# Patient Record
Sex: Female | Born: 1990 | Race: White | Hispanic: No | Marital: Single | Smoking: Never smoker
Health system: Southern US, Community
[De-identification: ages and names within clinical notes are randomized; demographics above are authoritative.]

## PROBLEM LIST (undated history)

## (undated) DIAGNOSIS — K811 Chronic cholecystitis: Secondary | ICD-10-CM

## (undated) DIAGNOSIS — F329 Major depressive disorder, single episode, unspecified: Secondary | ICD-10-CM

## (undated) DIAGNOSIS — F32A Depression, unspecified: Secondary | ICD-10-CM

## (undated) DIAGNOSIS — R51 Headache: Secondary | ICD-10-CM

## (undated) DIAGNOSIS — R519 Headache, unspecified: Secondary | ICD-10-CM

## (undated) DIAGNOSIS — F419 Anxiety disorder, unspecified: Secondary | ICD-10-CM

## (undated) DIAGNOSIS — K219 Gastro-esophageal reflux disease without esophagitis: Secondary | ICD-10-CM

## (undated) HISTORY — PX: WISDOM TOOTH EXTRACTION: SHX21

## (undated) HISTORY — PX: TONSILLECTOMY: SUR1361

## (undated) HISTORY — DX: Gastro-esophageal reflux disease without esophagitis: K21.9

---
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
## 2012-07-19 ENCOUNTER — Other Ambulatory Visit (HOSPITAL_COMMUNITY)
Admission: RE | Admit: 2012-07-19 | Discharge: 2012-07-19 | Disposition: A | Discharge status: Home / Self Care | Payer: BC Managed Care – PPO | Source: Ambulatory Visit | Attending: Family Medicine | Admitting: Family Medicine

## 2012-07-19 ENCOUNTER — Ambulatory Visit (INDEPENDENT_AMBULATORY_CARE_PROVIDER_SITE_OTHER): Payer: BC Managed Care – PPO | Admitting: Physician Assistant

## 2012-07-19 ENCOUNTER — Encounter: Payer: Self-pay | Admitting: Physician Assistant

## 2012-07-19 VITALS — BP 121/84 | HR 81 | Ht 65.0 in | Wt 147.0 lb

## 2012-07-19 DIAGNOSIS — Z13220 Encounter for screening for lipoid disorders: Secondary | ICD-10-CM

## 2012-07-19 DIAGNOSIS — R5383 Other fatigue: Secondary | ICD-10-CM

## 2012-07-19 DIAGNOSIS — M25569 Pain in unspecified knee: Secondary | ICD-10-CM

## 2012-07-19 DIAGNOSIS — Z01419 Encounter for gynecological examination (general) (routine) without abnormal findings: Secondary | ICD-10-CM | POA: Insufficient documentation

## 2012-07-19 DIAGNOSIS — M25561 Pain in right knee: Secondary | ICD-10-CM

## 2012-07-19 DIAGNOSIS — Z131 Encounter for screening for diabetes mellitus: Secondary | ICD-10-CM

## 2012-07-19 DIAGNOSIS — R5381 Other malaise: Secondary | ICD-10-CM

## 2012-07-19 DIAGNOSIS — Z124 Encounter for screening for malignant neoplasm of cervix: Secondary | ICD-10-CM

## 2012-07-19 LAB — CBC
HCT: 39.8 % (ref 36.0–46.0)
MCHC: 32.9 g/dL (ref 30.0–36.0)
Platelets: 195 10*3/uL (ref 150–400)
RDW: 14.1 % (ref 11.5–15.5)

## 2012-07-19 LAB — COMPLETE METABOLIC PANEL WITH GFR
ALT: 10 U/L (ref 0–35)
Alkaline Phosphatase: 62 U/L (ref 39–117)
CO2: 27 mEq/L (ref 19–32)
Sodium: 140 mEq/L (ref 135–145)
Total Bilirubin: 0.6 mg/dL (ref 0.3–1.2)
Total Protein: 7.2 g/dL (ref 6.0–8.3)

## 2012-07-19 LAB — VITAMIN B12: Vitamin B-12: 341 pg/mL (ref 211–911)

## 2012-07-19 LAB — LIPID PANEL
HDL: 71 mg/dL (ref >39)
LDL Cholesterol: 84 mg/dL (ref 0–99)
Total CHOL/HDL Ratio: 2.3 Ratio

## 2012-07-19 MED ORDER — IBUPROFEN 600 MG PO TABS: 600.0000 mg | ORAL_TABLET | Freq: Four times a day (QID) | ORAL | Status: DC | PRN

## 2012-07-19 MED ORDER — NORGESTIM-ETH ESTRAD TRIPHASIC 0.18/0.215/0.25 MG-35 MCG PO TABS: 1.0000 | ORAL_TABLET | Freq: Every day | ORAL | Status: DC

## 2012-07-19 NOTE — Patient Instructions (Addendum)
Knee Exercises EXERCISES RANGE OF MOTION(ROM) AND STRETCHING EXERCISES These exercises may help you when beginning to rehabilitate your injury. Your symptoms may resolve with or without further involvement from your physician, physical therapist or athletic trainer. While completing these exercises, remember:   Restoring tissue flexibility helps normal motion to return to the joints. This allows healthier, less painful movement and activity.  An effective stretch should be held for at least 30 seconds.  A stretch should never be painful. You should only feel a gentle lengthening or release in the stretched tissue. STRETCH - Knee Extension, Prone  Lie on your stomach on a firm surface, such as a bed or countertop. Place your right / left knee and leg just beyond the edge of the surface. You may wish to place a towel under the far end of your right / left thigh for comfort.  Relax your leg muscles and allow gravity to straighten your knee. Your clinician may advise you to add an ankle weight if more resistance is helpful for you.  You should feel a stretch in the back of your right / left knee. Hold this position for __________ seconds. Repeat __________ times. Complete this stretch __________ times per day. * Your physician, physical therapist or athletic trainer may ask you to add ankle weight to enhance your stretch.  RANGE OF MOTION - Knee Flexion, Active  Lie on your back with both knees straight. (If this causes back discomfort, bend your opposite knee, placing your foot flat on the floor.)  Slowly slide your heel back toward your buttocks until you feel a gentle stretch in the front of your knee or thigh.  Hold for __________ seconds. Slowly slide your heel back to the starting position. Repeat __________ times. Complete this exercise __________ times per day.  STRETCH - Quadriceps, Prone   Lie on your stomach on a firm surface, such as a bed or padded floor.  Bend your right /  left knee and grasp your ankle. If you are unable to reach, your ankle or pant leg, use a belt around your foot to lengthen your reach.  Gently pull your heel toward your buttocks. Your knee should not slide out to the side. You should feel a stretch in the front of your thigh and/or knee.  Hold this position for __________ seconds. Repeat __________ times. Complete this stretch __________ times per day.  STRETCH  Hamstrings, Supine   Lie on your back. Loop a belt or towel over the ball of your right / left foot.  Straighten your right / left knee and slowly pull on the belt to raise your leg. Do not allow the right / left knee to bend. Keep your opposite leg flat on the floor.  Raise the leg until you feel a gentle stretch behind your right / left knee or thigh. Hold this position for __________ seconds. Repeat __________ times. Complete this stretch __________ times per day.  STRENGTHENING EXERCISES These exercises may help you when beginning to rehabilitate your injury. They may resolve your symptoms with or without further involvement from your physician, physical therapist or athletic trainer. While completing these exercises, remember:   Muscles can gain both the endurance and the strength needed for everyday activities through controlled exercises.  Complete these exercises as instructed by your physician, physical therapist or athletic trainer. Progress the resistance and repetitions only as guided.  You may experience muscle soreness or fatigue, but the pain or discomfort you are trying to eliminate should   never worsen during these exercises. If this pain does worsen, stop and make certain you are following the directions exactly. If the pain is still present after adjustments, discontinue the exercise until you can discuss the trouble with your clinician. STRENGTH - Quadriceps, Isometrics  Lie on your back with your right / left leg extended and your opposite knee bent.  Gradually  tense the muscles in the front of your right / left thigh. You should see either your knee cap slide up toward your hip or increased dimpling just above the knee. This motion will push the back of the knee down toward the floor/mat/bed on which you are lying.  Hold the muscle as tight as you can without increasing your pain for __________ seconds.  Relax the muscles slowly and completely in between each repetition. Repeat __________ times. Complete this exercise __________ times per day.  STRENGTH - Quadriceps, Short Arcs   Lie on your back. Place a __________ inch towel roll under your knee so that the knee slightly bends.  Raise only your lower leg by tightening the muscles in the front of your thigh. Do not allow your thigh to rise.  Hold this position for __________ seconds. Repeat __________ times. Complete this exercise __________ times per day.  OPTIONAL ANKLE WEIGHTS: Begin with ____________________, but DO NOT exceed ____________________. Increase in 1 pound/0.5 kilogram increments.  STRENGTH - Quadriceps, Straight Leg Raises  Quality counts! Watch for signs that the quadriceps muscle is working to insure you are strengthening the correct muscles and not "cheating" by substituting with healthier muscles.  Lay on your back with your right / left leg extended and your opposite knee bent.  Tense the muscles in the front of your right / left thigh. You should see either your knee cap slide up or increased dimpling just above the knee. Your thigh may even quiver.  Tighten these muscles even more and raise your leg 4 to 6 inches off the floor. Hold for __________ seconds.  Keeping these muscles tense, lower your leg.  Relax the muscles slowly and completely in between each repetition. Repeat __________ times. Complete this exercise __________ times per day.  STRENGTH - Hamstring, Curls  Lay on your stomach with your legs extended. (If you lay on a bed, your feet may hang over the  edge.)  Tighten the muscles in the back of your thigh to bend your right / left knee up to 90 degrees. Keep your hips flat on the bed/floor.  Hold this position for __________ seconds.  Slowly lower your leg back to the starting position. Repeat __________ times. Complete this exercise __________ times per day.  OPTIONAL ANKLE WEIGHTS: Begin with ____________________, but DO NOT exceed ____________________. Increase in 1 pound/0.5 kilogram increments.  STRENGTH  Quadriceps, Squats  Stand in a door frame so that your feet and knees are in line with the frame.  Use your hands for balance, not support, on the frame.  Slowly lower your weight, bending at the hips and knees. Keep your lower legs upright so that they are parallel with the door frame. Squat only within the range that does not increase your knee pain. Never let your hips drop below your knees.  Slowly return upright, pushing with your legs, not pulling with your hands. Repeat __________ times. Complete this exercise __________ times per day.  STRENGTH - Quadriceps, Wall Slides  Follow guidelines for form closely. Increased knee pain often results from poorly placed feet or knees.  Lean against   a smooth wall or door and walk your feet out 18-24 inches. Place your feet hip-width apart.  Slowly slide down the wall or door until your knees bend __________ degrees.* Keep your knees over your heels, not your toes, and in line with your hips, not falling to either side.  Hold for __________ seconds. Stand up to rest for __________ seconds in between each repetition. Repeat __________ times. Complete this exercise __________ times per day. * Your physician, physical therapist or athletic trainer will alter this angle based on your symptoms and progress. Document Released: 11/30/2004 Document Revised: 04/10/2011 Document Reviewed: 04/30/2008 [employer] Patient Information 2014 [employer].  

## 2012-07-21 NOTE — Progress Notes (Signed)
  Subjective:     Erin Ashley is a 22 y.o. female and is here for a comprehensive physical exam. The patient reports problems - She does feel tired and would like labs just to make sure no vitamin that she is deficent in. she also has some right knee pain from old sports injury that has been giving her more problems lately. Pain located behind knee with flexion..  Needs refill on OCP. Never had pap. Normal regular periods. Not sexually active.  History   Social History  . Marital Status: Single    Spouse Name: N/A    Number of Children: N/A  . Years of Education: N/A   Occupational History  . Not on file.   Social History Main Topics  . Smoking status: Never Smoker   . Smokeless tobacco: Not on file  . Alcohol Use: Yes  . Drug Use: No  . Sexually Active: No   Other Topics Concern  . Not on file   Social History Narrative  . No narrative on file   Health Maintenance  Topic Date Due  . Pap Smear  06/22/2009  . Tetanus/tdap  06/23/2010  . Influenza Vaccine  09/30/2012    The following portions of the patient's history were reviewed and updated as appropriate: allergies, current medications, past family history, past medical history, past social history, past surgical history and problem list.  Review of Systems A comprehensive review of systems was negative.   Objective:    BP 121/84  Pulse 81  Ht 5\' 5"  (1.651 m)  Wt 147 lb (66.679 kg)  BMI 24.46 kg/m2  LMP 07/08/2012 General appearance: alert, cooperative and appears stated age Head: Normocephalic, without obvious abnormality, atraumatic Eyes: conjunctivae/corneas clear. PERRL, EOM's intact. Fundi benign. Ears: normal TM's and external ear canals both ears Nose: Nares normal. Septum midline. Mucosa normal. No drainage or sinus tenderness. Throat: lips, mucosa, and tongue normal; teeth and gums normal Neck: no adenopathy, no carotid bruit, no JVD, supple, symmetrical, trachea midline and thyroid not enlarged,  symmetric, no tenderness/mass/nodules Back: symmetric, no curvature. ROM normal. No CVA tenderness. Lungs: clear to auscultation bilaterally Heart: regular rate and rhythm, S1, S2 normal, no murmur, click, rub or gallop Abdomen: soft, non-tender; bowel sounds normal; no masses,  no organomegaly Pelvic: cervix normal in appearance, external genitalia normal, no adnexal masses or tenderness, no cervical motion tenderness, uterus normal size, shape, and consistency and vagina normal without discharge Extremities: extremities normal, atraumatic, no cyanosis or edemaright knee in knee brace. No joint tenderness. Negative anterior drawer, mcmurrays. Pain with flexion in right knee. Strength 5/5 bilaterally.  Pulses: 2+ and symmetric Skin: Skin color, texture, turgor normal. No rashes or lesions Lymph nodes: Cervical, supraclavicular, and axillary nodes normal. Neurologic: Grossly normal    Assessment:    Healthy female exam.      Plan:    CPE- Screening labs were ordered. Pap completed. Ordered tSH, CBC, vit D and B12 for fatigue. Refilled OCP. Gave knee strengthen exercises. Encouraged to use ibuprofen. Follow up if not improving in next 4 weeks.   See After Visit Summary for Counseling Recommendations

## 2013-07-01 ENCOUNTER — Ambulatory Visit (INDEPENDENT_AMBULATORY_CARE_PROVIDER_SITE_OTHER): Payer: BC Managed Care – PPO | Admitting: Physician Assistant

## 2013-07-01 ENCOUNTER — Encounter: Payer: Self-pay | Admitting: Physician Assistant

## 2013-07-01 VITALS — BP 111/71 | HR 81 | Ht 65.0 in | Wt 156.0 lb

## 2013-07-01 DIAGNOSIS — F329 Major depressive disorder, single episode, unspecified: Secondary | ICD-10-CM | POA: Insufficient documentation

## 2013-07-01 DIAGNOSIS — F32A Depression, unspecified: Secondary | ICD-10-CM

## 2013-07-01 DIAGNOSIS — Z309 Encounter for contraceptive management, unspecified: Secondary | ICD-10-CM

## 2013-07-01 DIAGNOSIS — F411 Generalized anxiety disorder: Secondary | ICD-10-CM

## 2013-07-01 DIAGNOSIS — F3289 Other specified depressive episodes: Secondary | ICD-10-CM

## 2013-07-01 MED ORDER — NORGESTIM-ETH ESTRAD TRIPHASIC 0.18/0.215/0.25 MG-35 MCG PO TABS: 1.0000 | ORAL_TABLET | Freq: Every day | ORAL | Status: DC

## 2013-07-01 MED ORDER — ESCITALOPRAM OXALATE 10 MG PO TABS: 10.0000 mg | ORAL_TABLET | Freq: Every day | ORAL | Status: DC

## 2013-07-01 NOTE — Progress Notes (Signed)
   Subjective:    Patient ID: Erin Ashley, female    DOB: 11-Sep-1990, 23 y.o.   MRN: [id number]  HPI Patient is a 23 year old female who presents to the clinic for birth control refill. Should also like to discuss ongoing anxiety and depression.  She's had no problems with her birth control pill. Her last Pap was 2014 and normal. She is not sexually active. Her periods are regular and without pain or discomfort.  She's noticed she's felt down and depressed for the last 6 months. About a year ago she had labs to check for fatigue. She continues to have no motivation. Her mother has been in the hospital for the last 3 months. She is starting school in 2 weeks. She would just like something to help make her feel more motivated and uplifting. She denies any suicidal or homicidal thoughts. She continues to feel uneasy about her future. She has never tried anything for anxiety or depression.    Review of Systems  All other systems reviewed and are negative.      Objective:   Physical Exam  Constitutional: She is oriented to person, place, and time. She appears well-developed and well-nourished.  HENT:  Head: Normocephalic and atraumatic.  Cardiovascular: Normal rate, regular rhythm and normal heart sounds.   Pulmonary/Chest: Effort normal and breath sounds normal.  Neurological: She is alert and oriented to person, place, and time.  Skin: Skin is dry.  Psychiatric: She has a normal mood and affect. Her behavior is normal.          Assessment & Plan:  Contraception management-birth control refilled for one year. Patient's blood pressure and heart rate looks good. Patient seems to be tolerating birth control well. Discussed with patient she does not need a Pap smear for 2 years. She still should come in for yearly appointment to recheck tolerance.  Anxiety/depression-GAD-7 was 12. PHQ-9 was 13. Decided to start Lexapro to hopefully give her some motivation. Discussed side effects of  SSRIs. Patient aware she is worsening depression or suicidal thoughts to stop medicine and call office. Patient is aware may take 4-6 weeks to become fully effective. Followup in 4-6 weeks.

## 2013-07-30 ENCOUNTER — Encounter: Payer: Self-pay | Admitting: Physician Assistant

## 2013-07-30 ENCOUNTER — Ambulatory Visit (INDEPENDENT_AMBULATORY_CARE_PROVIDER_SITE_OTHER): Payer: BC Managed Care – PPO | Admitting: Physician Assistant

## 2013-07-30 VITALS — BP 100/62 | HR 88 | Ht 65.0 in | Wt 154.0 lb

## 2013-07-30 DIAGNOSIS — F3289 Other specified depressive episodes: Secondary | ICD-10-CM

## 2013-07-30 DIAGNOSIS — F411 Generalized anxiety disorder: Secondary | ICD-10-CM

## 2013-07-30 DIAGNOSIS — F329 Major depressive disorder, single episode, unspecified: Secondary | ICD-10-CM

## 2013-07-30 DIAGNOSIS — F32A Depression, unspecified: Secondary | ICD-10-CM

## 2013-07-30 MED ORDER — ESCITALOPRAM OXALATE 10 MG PO TABS: 10.0000 mg | ORAL_TABLET | Freq: Every day | ORAL | Status: DC

## 2013-07-30 NOTE — Progress Notes (Signed)
   Subjective:    Patient ID: Erin Ashley, female    DOB: 1990/06/15, 23 y.o.   MRN: [id number]  HPI Pt is here to follow up on anxiety and depression. She is much better on lexapro daily. She has had no side effects. She is sleeping well. She is doing well in school and much better with handling stress of her sick mother. Denies any sucidal or homicidal thoughts.    Review of Systems  All other systems reviewed and are negative.      Objective:   Physical Exam  Constitutional: She is oriented to person, place, and time. She appears well-developed and well-nourished.  HENT:  Head: Normocephalic and atraumatic.  Cardiovascular: Normal rate, regular rhythm and normal heart sounds.   Pulmonary/Chest: Effort normal and breath sounds normal. She has no wheezes.  Neurological: She is alert and oriented to person, place, and time.  Psychiatric: She has a normal mood and affect. Her behavior is normal.          Assessment & Plan:  Anxiety/depression- GAD-7 was 1. PHQ-9 was 1. Refilled lexpro for 6 months. Follow up as needed. Encouraged regular exercise.

## 2013-09-12 ENCOUNTER — Encounter: Payer: Self-pay | Admitting: Physician Assistant

## 2013-09-12 ENCOUNTER — Ambulatory Visit (INDEPENDENT_AMBULATORY_CARE_PROVIDER_SITE_OTHER): Payer: BC Managed Care – PPO | Admitting: Physician Assistant

## 2013-09-12 VITALS — BP 117/71 | HR 96 | Ht 65.0 in | Wt 155.0 lb

## 2013-09-12 DIAGNOSIS — H1045 Other chronic allergic conjunctivitis: Secondary | ICD-10-CM | POA: Diagnosis not present

## 2013-09-12 DIAGNOSIS — H1013 Acute atopic conjunctivitis, bilateral: Secondary | ICD-10-CM

## 2013-09-12 MED ORDER — AZELASTINE HCL 0.05 % OP SOLN: 1.0000 [drp] | Freq: Two times a day (BID) | OPHTHALMIC | Status: DC

## 2013-09-12 NOTE — Patient Instructions (Signed)
Allergic Conjunctivitis  The conjunctiva is a thin membrane that covers the visible white part of the eyeball and the underside of the eyelids. This membrane protects and lubricates the eye. The membrane has small blood vessels running through it that can normally be seen. When the conjunctiva becomes inflamed, the condition is called conjunctivitis. In response to the inflammation, the conjunctival blood vessels become swollen. The swelling results in redness in the normally white part of the eye.  The blood vessels of this membrane also react when a person has allergies and is then called allergic conjunctivitis. This condition usually lasts for as long as the allergy persists. Allergic conjunctivitis cannot be passed to another person (non-contagious). The likelihood of bacterial infection is great and the cause is not likely due to allergies if the inflamed eye has:  · A sticky discharge.  · Discharge or sticking together of the lids in the morning.  · Scaling or flaking of the eyelids where the eyelashes come out.  · Red swollen eyelids.  CAUSES   · Viruses.  · Irritants such as foreign bodies.  · Chemicals.  · General allergic reactions.  · Inflammation or serious diseases in the inside or the outside of the eye or the orbit (the boney cavity in which the eye sits) can cause a "red eye."  SYMPTOMS   · Eye redness.  · Tearing.  · Itchy eyes.  · Burning feeling in the eyes.  · Clear drainage from the eye.  · Allergic reaction due to pollens or ragweed sensitivity. Seasonal allergic conjunctivitis is frequent in the spring when pollens are in the air and in the fall.  DIAGNOSIS   This condition, in its many forms, is usually diagnosed based on the history and an ophthalmological exam. It usually involves both eyes. If your eyes react at the same time every year, allergies may be the cause. While most "red eyes" are due to allergy or an infection, the role of an eye (ophthalmological) exam is important. The exam  can rule out serious diseases of the eye or orbit.  TREATMENT   · Non-antibiotic eye drops, ointments, or medications by mouth may be prescribed if the ophthalmologist is sure the conjunctivitis is due to allergies alone.  · Over-the-counter drops and ointments for allergic symptoms should be used only after other causes of conjunctivitis have been ruled out, or as your caregiver suggests.  Medications by mouth are often prescribed if other allergy-related symptoms are present. If the ophthalmologist is sure that the conjunctivitis is due to allergies alone, treatment is normally limited to drops or ointments to reduce itching and burning.  HOME CARE INSTRUCTIONS   · Wash hands before and after applying drops or ointments, or touching the inflamed eye(s) or eyelids.  · Do not let the eye dropper tip or ointment tube touch the eyelid when putting medicine in your eye.  · Stop using your soft contact lenses and throw them away. Use a new pair of lenses when recovery is complete. You should run through sterilizing cycles at least three times before use after complete recovery if the old soft contact lenses are to be used. Hard contact lenses should be stopped. They need to be thoroughly sterilized before use after recovery.  · Itching and burning eyes due to allergies is often relieved by using a cool cloth applied to closed eye(s).  SEEK MEDICAL CARE IF:   · Your problems do not go away after two or three days of treatment.  ·   Your lids are sticky (especially in the morning when you wake up) or stick together.  · Discharge develops. Antibiotics may be needed either as drops, ointment, or by mouth.  · You have extreme light sensitivity.  · An oral temperature above 102° F (38.9° C) develops.  · Pain in or around the eye or any other visual symptom develops.  MAKE SURE YOU:   · Understand these instructions.  · Will watch your condition.  · Will get help right away if you are not doing well or get worse.  Document  Released: 04/08/2002 Document Revised: 04/10/2011 Document Reviewed: 03/04/2007  [employer]® Patient Information ©2015 [employer]. This information is not intended to replace advice given to you by your health care provider. Make sure you discuss any questions you have with your health care provider.

## 2013-09-12 NOTE — Progress Notes (Signed)
   Subjective:    Patient ID: Erin GellAli Ashley, female    DOB: 28-Oct-1990, 23 y.o.   MRN: [id number]  HPI Patient presents to the clinic with 4 days of bilateral red eyes and itchiness. She noted symptoms starting Tuesday. She put her contacts to go into work that night and they were very irritated. She feels like her eyes are dry.  They're better in the morning and get worse as the day goes on. She denies any vision changes. She has tried over-the-counter allergic eyedrops She denies any discharge. [place] and feels like her eyes were worse. She denies any fever, chills, cough, sinus pressure, sore throat or ear pain.       Review of Systems  All other systems reviewed and are negative.      Objective:   Physical Exam  Constitutional: She is oriented to person, place, and time. She appears well-developed and well-nourished.  HENT:  Head: Normocephalic and atraumatic.  Right Ear: External ear normal.  Left Ear: External ear normal.  Nose: Nose normal.  Mouth/Throat: Oropharynx is clear and moist. No oropharyngeal exudate.  Eyes:  Bilateral injected conjunctiva with no active discharge.  Neck: Normal range of motion. Neck supple.  Cardiovascular: Normal rate, regular rhythm and normal heart sounds.   Pulmonary/Chest: Effort normal and breath sounds normal.  Lymphadenopathy:    She has no cervical adenopathy.  Neurological: She is alert and oriented to person, place, and time.  Psychiatric: She has a normal mood and affect. Her behavior is normal.          Assessment & Plan:  Allergic conjunctivitis bilateral- discuss with patient diagnosis and gave handout. Gave patient option of our to use twice a day. Change contacts and give eyes rest from contacts for next 2 days. Follow up as needed.

## 2014-03-02 ENCOUNTER — Other Ambulatory Visit: Payer: Self-pay | Admitting: Physician Assistant

## 2014-05-12 ENCOUNTER — Other Ambulatory Visit: Payer: Self-pay | Admitting: Physician Assistant

## 2014-05-13 ENCOUNTER — Ambulatory Visit (INDEPENDENT_AMBULATORY_CARE_PROVIDER_SITE_OTHER): Payer: BLUE CROSS/BLUE SHIELD | Admitting: Physician Assistant

## 2014-05-13 ENCOUNTER — Encounter: Payer: Self-pay | Admitting: Physician Assistant

## 2014-05-13 VITALS — BP 119/77 | HR 77 | Wt 171.0 lb

## 2014-05-13 DIAGNOSIS — F411 Generalized anxiety disorder: Secondary | ICD-10-CM

## 2014-05-13 DIAGNOSIS — F329 Major depressive disorder, single episode, unspecified: Secondary | ICD-10-CM

## 2014-05-13 DIAGNOSIS — F32A Depression, unspecified: Secondary | ICD-10-CM

## 2014-05-13 MED ORDER — ESCITALOPRAM OXALATE 20 MG PO TABS: 20.0000 mg | ORAL_TABLET | Freq: Every day | ORAL | Status: DC

## 2014-05-13 NOTE — Progress Notes (Addendum)
   Subjective:    Patient ID: Erin Ashley, female    DOB: 1990/09/15, 24 y.o.   MRN: [id number]  HPI Pt presents to the clinic to discuss worsening anxiety and depression. lexapro has worked really well for her but she feels overwhelmed right now. 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 She goes to school full time and expected to keep up the house. Her dad works hard but does not clean or do anything around the house. She has daily arguments with him. She feels guilty that she never does enough. No sucidal or homicidal thoughts.    Review of Systems  All other systems reviewed and are negative.      Objective:   Physical Exam  Constitutional: She is oriented to person, place, and time. She appears well-developed and well-nourished.  HENT:  Head: Normocephalic and atraumatic.  Cardiovascular: Normal rate, regular rhythm and normal heart sounds.   Pulmonary/Chest: Effort normal and breath sounds normal.  Neurological: She is alert and oriented to person, place, and time.  Skin: Skin is dry.  Psychiatric: She has a normal mood and affect. Her behavior is normal.  Tearful when talking about all her job responsibilities.           Assessment & Plan:  Anxiety/depression- GAD-7 was 9 . PHQ-9 was 8. We decided to increase lexapro to 20mg . She tolerates medication very well. Refilled for 6 months. Discussed a chore chart at the house so that everyone helps. Discussed with patient to talk with mothers provider and see if they qualify for a home aid for a few hours a week. Follow up as needed.

## 2014-05-19 ENCOUNTER — Other Ambulatory Visit: Payer: Self-pay | Admitting: Physician Assistant

## 2014-05-19 ENCOUNTER — Telehealth: Payer: Self-pay | Admitting: *Deleted

## 2014-05-19 MED ORDER — NORGESTIM-ETH ESTRAD TRIPHASIC 0.18/0.215/0.25 MG-35 MCG PO TABS: 1.0000 | ORAL_TABLET | Freq: Every day | ORAL | Status: DC

## 2014-05-19 NOTE — Telephone Encounter (Signed)
Mother calling to see if Karie Mainlandli could get refill on birth control med. Or would she need an office visit?

## 2014-05-19 NOTE — Telephone Encounter (Signed)
Pt has up to date pap and was just seen with good BP. Sent for 1 year.

## 2014-06-12 ENCOUNTER — Ambulatory Visit (INDEPENDENT_AMBULATORY_CARE_PROVIDER_SITE_OTHER): Payer: BLUE CROSS/BLUE SHIELD | Admitting: Sports Medicine

## 2014-06-12 ENCOUNTER — Encounter: Payer: Self-pay | Admitting: Sports Medicine

## 2014-06-12 VITALS — BP 125/80 | HR 86 | Temp 98.1°F | Ht 64.0 in | Wt 169.0 lb

## 2014-06-12 DIAGNOSIS — J029 Acute pharyngitis, unspecified: Secondary | ICD-10-CM | POA: Diagnosis not present

## 2014-06-12 DIAGNOSIS — J0100 Acute maxillary sinusitis, unspecified: Secondary | ICD-10-CM | POA: Insufficient documentation

## 2014-06-12 DIAGNOSIS — J0101 Acute recurrent maxillary sinusitis: Secondary | ICD-10-CM | POA: Diagnosis not present

## 2014-06-12 LAB — POCT RAPID STREP A (OFFICE): Rapid Strep A Screen: NEGATIVE

## 2014-06-12 MED ORDER — AZITHROMYCIN 250 MG PO TABS
ORAL_TABLET | ORAL | Status: DC
Start: 2014-06-12 — End: 2014-06-22

## 2014-06-12 MED ORDER — MELOXICAM 15 MG PO TABS: ORAL_TABLET | ORAL | Status: DC

## 2014-06-12 MED ORDER — ONDANSETRON 8 MG PO TBDP: 8.0000 mg | ORAL_TABLET | Freq: Three times a day (TID) | ORAL | Status: DC | PRN

## 2014-06-12 MED ORDER — FLUTICASONE PROPIONATE 50 MCG/ACT NA SUSP: NASAL | Status: AC

## 2014-06-12 NOTE — Assessment & Plan Note (Signed)
She does have a mild hot potato type voice concerning for a pharyngeal abscess. Exam is however unremarkable and more suspicious for maxillary sinusitis. Intranasal fluticasone, azithromycin, Zofran, meloxicam. If no better in one week we will CT scan her sinuses and pharynx.

## 2014-06-12 NOTE — Progress Notes (Signed)
  Subjective:    CC: Sore throat  HPI: This is a pleasant 24 year old female, she comes in with a several day history of increasing sore throat with low-grade fevers, nausea, and dizziness. She has noted a significant change in her voice. She is post-tonsillectomy and adenoidectomy. Symptoms are severe, persistent, with radiation to the ears particularly on the right side. No cough. Only minimal runny nose.  Past medical history, Surgical history, Family history not pertinant except as noted below, Social history, Allergies, and medications have been entered into the medical record, reviewed, and no changes needed.   Review of Systems: No fevers, chills, night sweats, weight loss, chest pain, or shortness of breath.   Objective:    General: Well Developed, well nourished, and in no acute distress.  Neuro: Alert and oriented x3, extra-ocular muscles intact, sensation grossly intact.  HEENT: Normocephalic, atraumatic, pupils equal round reactive to light, neck supple, no masses, no lymphadenopathy, thyroid nonpalpable. Noted hot potato type voice. Nasopharynx and ear canals are unremarkable, she did have some erythema without exudates in the posterior oropharynx without any visible evidence of a pharyngeal pillar abscess. She did have some tenderness over the maxillary sinuses bilaterally. Skin: Warm and dry, no rashes. Cardiac: Regular rate and rhythm, no murmurs rubs or gallops, no lower extremity edema.  Respiratory: Clear to auscultation bilaterally. Not using accessory muscles, speaking in full sentences.  Rapid strep was negative.  Impression and Recommendations:

## 2014-06-19 ENCOUNTER — Ambulatory Visit: Payer: BLUE CROSS/BLUE SHIELD | Admitting: Sports Medicine

## 2014-06-22 ENCOUNTER — Ambulatory Visit (INDEPENDENT_AMBULATORY_CARE_PROVIDER_SITE_OTHER): Payer: BLUE CROSS/BLUE SHIELD | Admitting: Sports Medicine

## 2014-06-22 ENCOUNTER — Encounter: Payer: Self-pay | Admitting: Sports Medicine

## 2014-06-22 VITALS — BP 115/69 | HR 101 | Temp 98.0°F | Wt 169.0 lb

## 2014-06-22 DIAGNOSIS — J301 Allergic rhinitis due to pollen: Secondary | ICD-10-CM | POA: Diagnosis not present

## 2014-06-22 DIAGNOSIS — Z9109 Other allergy status, other than to drugs and biological substances: Secondary | ICD-10-CM | POA: Insufficient documentation

## 2014-06-22 MED ORDER — OLOPATADINE HCL 0.1 % OP SOLN: 1.0000 [drp] | Freq: Two times a day (BID) | OPHTHALMIC | Status: DC

## 2014-06-22 NOTE — Assessment & Plan Note (Signed)
Maxillary sinusitis has completely resolved with antibiotics and nasal steroids. Continue Flonase. I did advise an over-the-counter second-generation histamine blocker, she does have significant allergic conjunctivitis as well so I am going to add Patanol. Return as needed.

## 2014-06-22 NOTE — Progress Notes (Signed)
  Subjective:    CC: Follow-up  HPI: Acute maxillary sinusitis: Symptoms completely resolved with antibiotics, she does tell me she has significant itchy eyes, and wonders what else can be done. She also wonders what else oral can be taken to help her allergic symptoms. She has noted a significant improvement with using Flonase regularly.  Past medical history, Surgical history, Family history not pertinant except as noted below, Social history, Allergies, and medications have been entered into the medical record, reviewed, and no changes needed.   Review of Systems: No fevers, chills, night sweats, weight loss, chest pain, or shortness of breath.   Objective:    General: Well Developed, well nourished, and in no acute distress.  Neuro: Alert and oriented x3, extra-ocular muscles intact, sensation grossly intact.  HEENT: Normocephalic, atraumatic, pupils equal round reactive to light, neck supple, no masses, no lymphadenopathy, thyroid nonpalpable.  Skin: Warm and dry, no rashes. Cardiac: Regular rate and rhythm, no murmurs rubs or gallops, no lower extremity edema.  Respiratory: Clear to auscultation bilaterally. Not using accessory muscles, speaking in full sentences.  Impression and Recommendations:

## 2014-09-11 ENCOUNTER — Ambulatory Visit (INDEPENDENT_AMBULATORY_CARE_PROVIDER_SITE_OTHER): Payer: BLUE CROSS/BLUE SHIELD | Admitting: Family Medicine

## 2014-09-11 VITALS — BP 116/74 | HR 90 | Temp 98.3°F | Wt 175.0 lb

## 2014-09-11 DIAGNOSIS — Z9229 Personal history of other drug therapy: Secondary | ICD-10-CM

## 2014-09-11 DIAGNOSIS — Z23 Encounter for immunization: Secondary | ICD-10-CM

## 2014-09-11 NOTE — Progress Notes (Signed)
Agree with note. 

## 2014-09-11 NOTE — Progress Notes (Signed)
   Subjective:    Patient ID: Erin Ashley, female    DOB: [birth year], 24 y.o.   MRN: [id number]  HPI  Patient came into clinic today for some immunizations required for her school. Pt needed to begin her Hep B series and get her tDap vaccine.   Review of Systems     Objective:   Physical Exam        Assessment & Plan:  Pt tolerated tdap injection in left deltoid and Hep B injection in right deltoid well with no immediate complications. Pt advised of administration schedule for Hep B series. Will call to schedule those appts. No further questions or concerns.

## 2015-02-18 ENCOUNTER — Telehealth: Payer: Self-pay | Admitting: Physician Assistant

## 2015-02-18 NOTE — Telephone Encounter (Signed)
Pt mother called and stated that Su's insurance has changed and no longer will cover the lexapro and wants to know if there is another medication that she can take that is similar like a generic. She has 5 pills left of her lexapro

## 2015-02-19 ENCOUNTER — Other Ambulatory Visit: Payer: Self-pay | Admitting: Physician Assistant

## 2015-02-19 MED ORDER — ESCITALOPRAM OXALATE 20 MG PO TABS: 20.0000 mg | ORAL_TABLET | Freq: Every day | ORAL | Status: DC

## 2015-02-19 NOTE — Progress Notes (Signed)
Pt calls in and lexapro too expensive because she lost insurance. i will give good rx coupon and send to [facility] since has membership this should make affordable.

## 2015-05-04 ENCOUNTER — Encounter: Payer: Self-pay | Admitting: Physician Assistant

## 2015-05-04 ENCOUNTER — Ambulatory Visit (INDEPENDENT_AMBULATORY_CARE_PROVIDER_SITE_OTHER): Payer: Self-pay | Admitting: Physician Assistant

## 2015-05-04 VITALS — BP 116/82 | HR 68 | Wt 182.0 lb

## 2015-05-04 DIAGNOSIS — F329 Major depressive disorder, single episode, unspecified: Secondary | ICD-10-CM

## 2015-05-04 DIAGNOSIS — IMO0001 Reserved for inherently not codable concepts without codable children: Secondary | ICD-10-CM | POA: Insufficient documentation

## 2015-05-04 DIAGNOSIS — F411 Generalized anxiety disorder: Secondary | ICD-10-CM

## 2015-05-04 DIAGNOSIS — H9190 Unspecified hearing loss, unspecified ear: Secondary | ICD-10-CM

## 2015-05-04 DIAGNOSIS — F810 Specific reading disorder: Secondary | ICD-10-CM | POA: Insufficient documentation

## 2015-05-04 DIAGNOSIS — F32A Depression, unspecified: Secondary | ICD-10-CM

## 2015-05-04 MED ORDER — ESCITALOPRAM OXALATE 20 MG PO TABS: 20.0000 mg | ORAL_TABLET | Freq: Every day | ORAL | Status: DC

## 2015-05-05 NOTE — Progress Notes (Signed)
   Subjective:    Patient ID: Erin Ashley, female    DOB: 21-Apr-1990, 25 y.o.   MRN: [id number]  HPI Pt is a 25 yo female who presents to the clinic to discuss medication due to cost. Pt lost her insurance and not lexapro is 50 dollars a month. She is doing great on it but can't afford it.   She also would like note for school for testing purposes. She has hearing impairment due to frequent ear infections as a child and she has always had reading comprehension issues. 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 She really feels like she needs more time. She always had extra time in middle school. No formal learning disability.    Review of Systems  All other systems reviewed and are negative.      Objective:   Physical Exam  Constitutional: She is oriented to person, place, and time. She appears well-developed and well-nourished.  HENT:  Head: Normocephalic and atraumatic.  Cardiovascular: Normal rate, regular rhythm and normal heart sounds.   Pulmonary/Chest: Effort normal and breath sounds normal. She has no wheezes.  Neurological: She is alert and oriented to person, place, and time.  Skin: Skin is dry.  Psychiatric: She has a normal mood and affect. Her behavior is normal.          Assessment & Plan:  Depression/anxiety- PHQ-9 was 3. GAD-7 was 0. Controlled on lexapro but concerned with cost. Gave good rx coupon and they have [facility] membership which should make 0 dollars. Continue on same dose. Follow up has needed.   Reading comprehension impairment/hearing impairment- note written for more time with testing. No formal testing has been done. She has no insurance at this time so will hold off. Never dx with any learning disability as a child but has always struggled at comprehension. Discuss ADD symptoms. She screened negative in office today. She certainly could have multiple causes of difficultly comprehending.   Does not use hearing aids. Her  speech is impaired due to hearing impairment.  Reading comprehension impairment/hearing impairment-

## 2015-05-11 ENCOUNTER — Other Ambulatory Visit: Payer: Self-pay | Admitting: *Deleted

## 2015-05-11 MED ORDER — NORGESTIM-ETH ESTRAD TRIPHASIC 0.18/0.215/0.25 MG-35 MCG PO TABS: 1.0000 | ORAL_TABLET | Freq: Every day | ORAL | Status: DC

## 2015-06-25 ENCOUNTER — Ambulatory Visit (INDEPENDENT_AMBULATORY_CARE_PROVIDER_SITE_OTHER): Payer: Self-pay

## 2015-06-25 ENCOUNTER — Ambulatory Visit (INDEPENDENT_AMBULATORY_CARE_PROVIDER_SITE_OTHER): Payer: Self-pay | Admitting: Family Medicine

## 2015-06-25 ENCOUNTER — Encounter: Payer: Self-pay | Admitting: Family Medicine

## 2015-06-25 VITALS — BP 125/77 | HR 85 | Wt 188.0 lb

## 2015-06-25 DIAGNOSIS — R0602 Shortness of breath: Secondary | ICD-10-CM

## 2015-06-25 DIAGNOSIS — R0989 Other specified symptoms and signs involving the circulatory and respiratory systems: Secondary | ICD-10-CM

## 2015-06-25 DIAGNOSIS — R09A2 Foreign body sensation, throat: Secondary | ICD-10-CM

## 2015-06-25 DIAGNOSIS — J029 Acute pharyngitis, unspecified: Secondary | ICD-10-CM

## 2015-06-25 DIAGNOSIS — F458 Other somatoform disorders: Secondary | ICD-10-CM

## 2015-06-25 LAB — CBC WITH DIFFERENTIAL/PLATELET
BASOS ABS: 0 {cells}/uL (ref 0–200)
Basophils Relative: 0 %
EOS PCT: 2 %
Eosinophils Absolute: 186 cells/uL (ref 15–500)
HCT: 38.4 % (ref 35.0–45.0)
Hemoglobin: 12.5 g/dL (ref 11.7–15.5)
LYMPHS ABS: 1767 {cells}/uL (ref 850–3900)
LYMPHS PCT: 19 %
MCH: 27.7 pg (ref 27.0–33.0)
MCHC: 32.6 g/dL (ref 32.0–36.0)
MCV: 85.1 fL (ref 80.0–100.0)
MONO ABS: 465 {cells}/uL (ref 200–950)
MONOS PCT: 5 %
MPV: 11.5 fL (ref 7.5–12.5)
Neutro Abs: 6882 cells/uL (ref 1500–7800)
Neutrophils Relative %: 74 %
PLATELETS: 222 10*3/uL (ref 140–400)
RBC: 4.51 MIL/uL (ref 3.80–5.10)
RDW: 14.1 % (ref 11.0–15.0)
WBC: 9.3 10*3/uL (ref 3.8–10.8)

## 2015-06-25 LAB — POCT RAPID STREP A (OFFICE): RAPID STREP A SCREEN: NEGATIVE

## 2015-06-25 NOTE — Progress Notes (Signed)
Subjective:    CC: ST  HPI: Patient comes in today complaining of feeling like there is a lump in the back of her throat from his 2 days. She said initially she thought maybe one her birth control pills got stuck. Then yesterday it started to become very uncomfortable with eating and drinking. This morning she tried to eat a little bit a spaghetti and it actually came back up. She said today she also feels a little bit more short of breath and did not feel that way yesterday. She says even walking in from the parking lot she noticed a difference. She denies any fevers or chills. She's had a little bit of mild congestion at night but nothing worrisome. No cough. No other swelling. She does have hx of seasaonl allergies.   Past medical history, Surgical history, Family history not pertinant except as noted below, Social history, Allergies, and medications have been entered into the medical record, reviewed, and corrections made.   Review of Systems: No fevers, chills, night sweats, weight loss, chest pain, or shortness of breath.   Objective:    Physical Exam  Constitutional: She is oriented to person, place, and time and well-developed, well-nourished, and in no distress. No distress.  HENT:  Head: Normocephalic and atraumatic.  Right Ear: External ear normal.  Left Ear: External ear normal.  Nose: Nose normal.  Mouth/Throat: Oropharynx is clear and moist.  Eyes: Conjunctivae and EOM are normal. Pupils are equal, round, and reactive to light.  Neck: Neck supple. No thyromegaly present.  Cardiovascular: Normal rate and normal heart sounds.   Pulmonary/Chest: Effort normal and breath sounds normal.  Musculoskeletal: She exhibits no edema.  Neurological: She is alert and oriented to person, place, and time.  Skin: Skin is warm. She is diaphoretic.  Psychiatric: Affect and judgment normal.     Impression and Recommendations:   Globus sensation-unclear etiology at this point. I do think we  should try get her in with ENT just to make sure that she doesn't have something actually stuck in the back of her throat. Clearly it is not causing a full obstruction because she is able to eat and drink some but it's uncomfortable enough that I think it needs to be further evaluated. She doesn't strike me as particularly anxious. Oropharynx is not red. Strep test was negative.  Shortness of breath-unclear etiology. Chest exam is clear today. Pulse ox is normal. She is on birth control so certainly pulmonary embolism is a possibility that she's not very symptomatic. We'll get a chest x-ray as well as check a d-dimer.

## 2015-06-26 LAB — D-DIMER, QUANTITATIVE: D-Dimer, Quant: 0.71 ug/mL-FEU — ABNORMAL HIGH (ref 0.00–0.48)

## 2015-06-29 ENCOUNTER — Ambulatory Visit (INDEPENDENT_AMBULATORY_CARE_PROVIDER_SITE_OTHER): Payer: Self-pay | Admitting: Physician Assistant

## 2015-06-29 ENCOUNTER — Encounter: Payer: Self-pay | Admitting: Physician Assistant

## 2015-06-29 VITALS — BP 112/71 | HR 78 | Temp 98.8°F | Ht 64.0 in | Wt 188.0 lb

## 2015-06-29 DIAGNOSIS — Z309 Encounter for contraceptive management, unspecified: Secondary | ICD-10-CM

## 2015-06-29 DIAGNOSIS — K2100 Gastro-esophageal reflux disease with esophagitis, without bleeding: Secondary | ICD-10-CM

## 2015-06-29 DIAGNOSIS — K121 Other forms of stomatitis: Secondary | ICD-10-CM

## 2015-06-29 DIAGNOSIS — Z789 Other specified health status: Secondary | ICD-10-CM

## 2015-06-29 MED ORDER — NORGESTIM-ETH ESTRAD TRIPHASIC 0.18/0.215/0.25 MG-35 MCG PO TABS: 1.0000 | ORAL_TABLET | Freq: Every day | ORAL | Status: DC

## 2015-06-29 MED ORDER — MAGIC MOUTHWASH W/LIDOCAINE: ORAL | Status: DC

## 2015-06-29 NOTE — Patient Instructions (Addendum)
Mouthwash Start omeprazole Tylenol as needed.     Food Choices for Gastroesophageal Reflux Disease, Adult When you have gastroesophageal reflux disease (GERD), the foods you eat and your eating habits are very important. Choosing the right foods can help ease the discomfort of GERD. WHAT GENERAL GUIDELINES DO I NEED TO FOLLOW?  Choose fruits, vegetables, whole grains, low-fat dairy products, and low-fat meat, fish, and poultry.  Limit fats such as oils, salad dressings, butter, nuts, and avocado.  Keep a food diary to identify foods that cause symptoms.  Avoid foods that cause reflux. These may be different for different people.  Eat frequent small meals instead of three large meals each day.  Eat your meals slowly, in a relaxed setting.  Limit fried foods.  Cook foods using methods other than frying.  Avoid drinking alcohol.  Avoid drinking large amounts of liquids with your meals.  Avoid bending over or lying down until 2-3 hours after eating. WHAT FOODS ARE NOT RECOMMENDED? The following are some foods and drinks that may worsen your symptoms: Vegetables Tomatoes. Tomato juice. Tomato and spaghetti sauce. Chili peppers. Onion and garlic. Horseradish. Fruits Oranges, grapefruit, and lemon (fruit and juice). Meats High-fat meats, fish, and poultry. This includes hot dogs, ribs, ham, sausage, salami, and bacon. Dairy Whole milk and chocolate milk. Sour cream. Cream. Butter. Ice cream. Cream cheese.  Beverages Coffee and tea, with or without caffeine. Carbonated beverages or energy drinks. Condiments Hot sauce. Barbecue sauce.  Sweets/Desserts Chocolate and cocoa. Donuts. Peppermint and spearmint. Fats and Oils High-fat foods, including [place] fries and potato chips. Other Vinegar. Strong spices, such as black pepper, white pepper, red pepper, cayenne, curry powder, cloves, ginger, and chili powder. The items listed above may not be a complete list of foods and  beverages to avoid. Contact your dietitian for more information.   This information is not intended to replace advice given to you by your health care provider. Make sure you discuss any questions you have with your health care provider.   Document Released: 01/16/2005 Document Revised: 02/06/2014 Document Reviewed: 11/20/2012 Elsevier Interactive Patient Education [employer]2016 [employer] Inc[employer]

## 2015-06-29 NOTE — Progress Notes (Signed)
   Subjective:    Patient ID: Erin Ashley, female    DOB: 1990/03/12, 25 y.o.   MRN: [id number]  HPI Patient is a 25 year old female who presents to the clinic with painful mouth ulcers that started yesterday. She was seen in the clinic on Friday for shortness of breath, globus sensation, sore throat. She was sent to ENT to make sure there was not anything stuck in throat. They suspected that she had GERD. She has not started any medications for acid reflux at this time. She does feel like the things she was complaining about on Friday have improved. She has these ulcers. She has not done anything to make better. She denies eating anything out of the normal. She denies any rash on her hands or feet. She did have a fever last night but has not had one since.   Review of Systems  All other systems reviewed and are negative.      Objective:   Physical Exam  Constitutional: She is oriented to person, place, and time. She appears well-developed and well-nourished.  HENT:  Head: Normocephalic and atraumatic.  Right Ear: External ear normal.  Left Ear: External ear normal.  TM's clear.  Bilateral buccal mucosa ulcerations.  Lesions sparing tongue or oropharynx.   Eyes: Conjunctivae are normal. Right eye exhibits no discharge. Left eye exhibits no discharge.  Neck: Normal range of motion. Neck supple.  Cardiovascular: Normal rate, regular rhythm and normal heart sounds.   Pulmonary/Chest: Effort normal and breath sounds normal. She has no wheezes.  Lymphadenopathy:    She has no cervical adenopathy.  Neurological: She is alert and oriented to person, place, and time.  Psychiatric: She has a normal mood and affect. Her behavior is normal.          Assessment & Plan:  Mouth ulcers- unclear etiology. do not see any rash on hand or feet for hand food and mouth infection. No fever today but reported fever. Could be acid in mouth reacting. Will send mouthwash. Tylenol/ibuprofen for  discomfort. Keep to a GERD diet. Go ahead and start omeprazole. At least for next 2 weeks and see how smptoms improve. Hold any NSAIDs.   GERD- I would like for patient to cut back on acidic foods until ulcers resolve. Start omeprazole daily. Hold NSAIDs.   Uses OCP- no insurance right now. Needs pap. Will refill for another 3 months. She hopes to start working in near future.    Discussed elevated d-dimer. No SOB today. I am not concerned with PE.

## 2015-06-30 DIAGNOSIS — K2100 Gastro-esophageal reflux disease with esophagitis, without bleeding: Secondary | ICD-10-CM | POA: Insufficient documentation

## 2015-06-30 DIAGNOSIS — K121 Other forms of stomatitis: Secondary | ICD-10-CM | POA: Insufficient documentation

## 2015-08-09 ENCOUNTER — Ambulatory Visit (INDEPENDENT_AMBULATORY_CARE_PROVIDER_SITE_OTHER): Payer: Self-pay | Admitting: Physician Assistant

## 2015-08-09 ENCOUNTER — Encounter: Payer: Self-pay | Admitting: Physician Assistant

## 2015-08-10 NOTE — Progress Notes (Signed)
   Subjective:    Patient ID: Erin Ashley, female    DOB: 05/04/1990, 25 y.o.   MRN: [id number]  HPI    Review of Systems     Objective:   Physical Exam        Assessment & Plan:  Canceled patient.

## 2015-10-11 ENCOUNTER — Encounter: Payer: Self-pay | Admitting: Physician Assistant

## 2015-10-11 ENCOUNTER — Ambulatory Visit (INDEPENDENT_AMBULATORY_CARE_PROVIDER_SITE_OTHER): Payer: Self-pay

## 2015-10-11 ENCOUNTER — Other Ambulatory Visit (HOSPITAL_COMMUNITY)
Admission: RE | Admit: 2015-10-11 | Discharge: 2015-10-11 | Disposition: A | Discharge status: Home / Self Care | Payer: Self-pay | Source: Ambulatory Visit | Attending: Physician Assistant | Admitting: Physician Assistant

## 2015-10-11 ENCOUNTER — Ambulatory Visit (INDEPENDENT_AMBULATORY_CARE_PROVIDER_SITE_OTHER): Payer: Self-pay | Admitting: Physician Assistant

## 2015-10-11 VITALS — BP 127/82 | HR 77 | Temp 98.6°F | Wt 192.0 lb

## 2015-10-11 DIAGNOSIS — Z9109 Other allergy status, other than to drugs and biological substances: Secondary | ICD-10-CM

## 2015-10-11 DIAGNOSIS — R2230 Localized swelling, mass and lump, unspecified upper limb: Secondary | ICD-10-CM | POA: Insufficient documentation

## 2015-10-11 DIAGNOSIS — R1032 Left lower quadrant pain: Secondary | ICD-10-CM

## 2015-10-11 DIAGNOSIS — J301 Allergic rhinitis due to pollen: Secondary | ICD-10-CM

## 2015-10-11 DIAGNOSIS — Z01419 Encounter for gynecological examination (general) (routine) without abnormal findings: Secondary | ICD-10-CM | POA: Insufficient documentation

## 2015-10-11 DIAGNOSIS — Z23 Encounter for immunization: Secondary | ICD-10-CM

## 2015-10-11 DIAGNOSIS — K5900 Constipation, unspecified: Secondary | ICD-10-CM | POA: Insufficient documentation

## 2015-10-11 DIAGNOSIS — R2231 Localized swelling, mass and lump, right upper limb: Secondary | ICD-10-CM

## 2015-10-11 DIAGNOSIS — N760 Acute vaginitis: Secondary | ICD-10-CM | POA: Insufficient documentation

## 2015-10-11 DIAGNOSIS — N898 Other specified noninflammatory disorders of vagina: Secondary | ICD-10-CM

## 2015-10-11 DIAGNOSIS — Z113 Encounter for screening for infections with a predominantly sexual mode of transmission: Secondary | ICD-10-CM | POA: Insufficient documentation

## 2015-10-11 DIAGNOSIS — Z124 Encounter for screening for malignant neoplasm of cervix: Secondary | ICD-10-CM

## 2015-10-11 MED ORDER — NORGESTIM-ETH ESTRAD TRIPHASIC 0.18/0.215/0.25 MG-35 MCG PO TABS: 1.0000 | ORAL_TABLET | Freq: Every day | ORAL | 4 refills | Status: DC

## 2015-10-11 NOTE — Progress Notes (Addendum)
Subjective:     Patient ID: Erin Ashley, female   DOB: [birth year], 25 y.o.   MRN: [id number]  HPI Patient is a 25 y.o. Caucasian female with PCOS presenting today for a screening PAP smear with three acute complaints. Firstly, the patient notes that she is having some pain in her left lower quadrant. The patient states that she believes it was secondary to her constipation as it improves slightly after a bowel movement. The patient states that she had one episode of diarrhea yesterday and has been taking Miralax and chocolate to help alleviate her constipation. The patient describes her pain as underneath her belly and that it worsens with movement especially twisting. The patient notes that she does heavy lifting at [employer] and this may be associated with her current symptoms. Additoinally, the patient states that she is having swelling under her right armpit. The patient states that it is non-painful and has been present for numerous years. Lastly, the patient notes that she has been having increased congestion due to her allergies. She states that she has been taking mucinex over-the-counter with some symptom relief. Patient denies fever, fatigue, rhinorrhea, or cough.   Review of Systems  Constitutional: Negative for activity change, fatigue and fever.  HENT: Positive for sinus pressure. Negative for congestion, ear discharge, ear pain, postnasal drip, rhinorrhea, sneezing and sore throat.   Eyes: Negative for pain, discharge and itching.  Respiratory: Negative for cough, chest tightness, shortness of breath and wheezing.   Cardiovascular: Negative for chest pain and palpitations.  Gastrointestinal: Positive for abdominal pain, constipation and diarrhea. Negative for abdominal distention, anal bleeding, blood in stool, nausea and vomiting.  Endocrine: Negative for cold intolerance and heat intolerance.  Genitourinary: Negative for difficulty urinating, dysuria, flank pain, frequency, hematuria and  urgency.  Musculoskeletal: Negative for arthralgias, back pain and myalgias.  Allergic/Immunologic: Positive for environmental allergies.  Neurological: Negative for dizziness, weakness, light-headedness, numbness and headaches.      Objective:   Physical Exam  Constitutional: She is oriented to person, place, and time. She appears well-developed and well-nourished. No distress.  HENT:  Head: Normocephalic and atraumatic.  Right Ear: External ear normal.  Left Ear: External ear normal.  Nose: Nose normal.  Mouth/Throat: Oropharynx is clear and moist. No oropharyngeal exudate.  Eyes: Conjunctivae and EOM are normal. Pupils are equal, round, and reactive to light. Right eye exhibits no discharge. Left eye exhibits no discharge. No scleral icterus.  Neck: Normal range of motion. Neck supple. No JVD present. No tracheal deviation present. No thyromegaly present.  Cardiovascular: Normal rate, regular rhythm, normal heart sounds and intact distal pulses.  Exam reveals no gallop and no friction rub.   No murmur heard. Pulmonary/Chest: Effort normal and breath sounds normal. No stridor. No respiratory distress. She has no wheezes. She has no rales. She exhibits no tenderness.  Non-tender palpable mass without discrete borders in the right axilla. No erythema. N0t fluctuant.   Abdominal: Soft. Bowel sounds are normal. She exhibits no distension and no mass. There is tenderness. There is no rebound and no guarding.  Mild tenderness to palpation in the left lower quadrant. No rebound or guarding.   Genitourinary: Uterus normal. Vaginal discharge found.  Musculoskeletal: Normal range of motion. She exhibits no tenderness.  Lymphadenopathy:    She has no cervical adenopathy.  Neurological: She is alert and oriented to person, place, and time. She has normal reflexes. She displays normal reflexes. No cranial nerve deficit. She exhibits normal muscle tone. Coordination  normal.  Skin: Skin is warm and  dry. No rash noted. She is not diaphoretic. No erythema. No pallor.  Psychiatric: She has a normal mood and affect. Her behavior is normal. Judgment and thought content normal.      Assessment:     Hallie was seen today for gynecologic exam, constipation and lump under armpit.  Diagnoses and all orders for this visit:  Pollen allergies  Encounter for immunization -     Flu Vaccine QUAD 36+ mos IM  Pap smear for cervical cancer screening -     Cytology - PAP  Constipation, unspecified constipation type -     DG Abd 1 View  Abdominal pain, left lower quadrant -     DG Abd 1 View  Other orders -     Norgestimate-Ethinyl Estradiol Triphasic 0.18/0.215/0.25 MG-35 MCG tablet; Take 1 tablet by mouth daily.      Plan:     1. Screening PAP smear/vaginal discharge- The patient received a PAP smear in office today with full pelvic exam. Patient revealed increased vaginal discharge and a wet prep was obtained. Resulting pending. Will determine need for further medical intervention upon receipt of results. Patients pap smear was sent for testing of HPV testing, gonorrhea, chlamydia, trichomonas, bacterial vaginosis. Will contact patient with results. Patient was given refill for oral contraceptive.  2. Abdominal pain, left lower quadrant - Patient was sent for x-ray to further evaluate potential etiology of her symptoms. Discussed with patient that her symptoms were likely secondary to constipation. Will determine appropriate medical regimen upon receiving imaging results. Will continue to monitor.   3.  Constipation - patient to continue taking Miralax over-the-counter. Will determine need for further medical intervention upon receipt of x-ray results. Will continue to monitor.     4. Allergies - Patient was advised to take over-the-counter antihistamines such as Claritin nightly. Patient was instructed to return-to-clinic if symptoms do not improve or worsen. Will continue to monitor.  5.  Right axilla mass- I suspect fatty tissue. Will continue to monitor. May consider u/s in the future.     Summary - Patient received screening pap smear and flu shot in clinic today. Patient to obtain abdominal x-ray. Will contact patient with results of both her pap smear and x-ray. Follow-up in 1 year for annual wellness exam or sooner if symptoms persist or worsen.

## 2015-10-13 LAB — CYTOLOGY - PAP

## 2015-10-15 LAB — CERVICOVAGINAL ANCILLARY ONLY: Candida vaginitis: NEGATIVE

## 2015-12-07 ENCOUNTER — Other Ambulatory Visit: Payer: Self-pay | Admitting: Physician Assistant

## 2016-02-28 ENCOUNTER — Encounter: Payer: Self-pay | Admitting: *Deleted

## 2016-02-28 ENCOUNTER — Emergency Department
Admission: EM | Admit: 2016-02-28 | Discharge: 2016-02-28 | Disposition: A | Discharge status: Home / Self Care | Payer: Self-pay | Source: Home / Self Care | Attending: Family Medicine | Admitting: Family Medicine

## 2016-02-28 DIAGNOSIS — T59891A Toxic effect of other specified gases, fumes and vapors, accidental (unintentional), initial encounter: Secondary | ICD-10-CM

## 2016-02-28 NOTE — ED Provider Notes (Signed)
[facility]    CSN: [id number] Arrival date & time: 02/28/16  1246     History   Chief Complaint Chief Complaint  Patient presents with  . Shortness of Breath    HPI Bonney Berres is a 26 y.o. female.   While cleaning a toilet about two hours ago patient mixed Lysol with about a half cup of liquid bleach.  The resulting fumes caused her to develop nausea with an episode of vomiting.  She felt shortness of breath and tightness in her anterior chest.  She has a history of anxiety and developed a panic attack with subsequent increased heart rate, dizziness, and wheezing.  She has a family history of asthma (mother). She feels much better now with resolution of her wheezing and shortness of breath although she she still feels some tightness in her anterior chest.   The history is provided by the patient.  Shortness of Breath  Severity:  Mild Onset quality:  Sudden Duration:  2 hours Timing:  Constant Progression:  Resolved Chronicity:  New Context: occupational exposure and strong odors   Relieved by:  None tried Worsened by:  Smoke exposure and stress Ineffective treatments:  None tried Associated symptoms: chest pain, vomiting and wheezing   Associated symptoms: no cough, no diaphoresis, no fever, no headaches, no hemoptysis, no neck pain, no rash, no sore throat, no sputum production and no syncope     History reviewed. No pertinent past medical history.  Patient Active Problem List   Diagnosis Date Noted  . Constipation 10/11/2015  . Abdominal pain, left lower quadrant 10/11/2015  . Axillary mass 10/11/2015  . Mouth ulcers 06/30/2015  . Gastroesophageal reflux disease with esophagitis 06/30/2015  . Hearing impairment 05/04/2015  . 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 Pollen allergies 06/22/2014  . Anxiety state 07/01/2013  . Depression 07/01/2013    History reviewed. No pertinent surgical history.  OB History    No data available        Home Medications    Prior to Admission medications   Medication Sig Start Date End Date Taking? Authorizing Provider  escitalopram (LEXAPRO) 20 MG tablet Take 1 tablet (20 mg total) by mouth daily. 05/04/15  Yes Jade L Breeback, PA-C  TRI-ESTARYLLA 0.18/0.215/0.25 MG-35 MCG tablet TAKE 1 TABLET BY MOUTH DAILY. 12/08/15  Yes Jade L Breeback, PA-C  fluticasone (FLONASE) 50 MCG/ACT nasal spray One spray in each nostril twice a day, use left hand for right nostril, and right hand for left nostril. 06/12/14   Monica Becton, MD    Family History Family History  Problem Relation Age of Onset  . Hyperlipidemia Father   . Hypertension Father   . Diabetes Father   . Hypertension Paternal Grandmother   . Hyperlipidemia Paternal Grandmother   . Hyperlipidemia Paternal Grandfather   . Hypertension Paternal Grandfather     Social History Social History  Substance Use Topics  . Smoking status: Never Smoker  . Smokeless tobacco: Never Used  . Alcohol use Yes     Allergies   Amoxicillin and Penicillins   Review of Systems Review of Systems  Constitutional: Negative for diaphoresis and fever.  HENT: Negative for sore throat.   Respiratory: Positive for shortness of breath and wheezing. Negative for cough, hemoptysis and sputum production.   Cardiovascular: Positive for chest pain. Negative for syncope.  Gastrointestinal: Positive for vomiting.  Musculoskeletal: Negative for neck pain.  Skin: Negative for rash.  Neurological: Negative for headaches.  Psychiatric/Behavioral: The patient  is nervous/anxious.   All other systems reviewed and are negative.    Physical Exam Triage Vital Signs ED Triage Vitals  Enc Vitals Group     BP 02/28/16 1335 102/72     Pulse Rate 02/28/16 1304 76     Resp 02/28/16 1304 18     Temp 02/28/16 1335 97.7 F (36.5 C)     Temp Source 02/28/16 1335 Oral     SpO2 02/28/16 1304 96 %     Weight 02/28/16 1336 190 lb (86.2 kg)     Height  02/28/16 1336 5\' 5"  (1.651 m)     Head Circumference --      Peak Flow --      Pain Score 02/28/16 1337 5     Pain Loc --      Pain Edu? --      Excl. in GC? --    No data found.   Updated Vital Signs BP 102/72 (BP Location: Left Arm)   Pulse 83   Temp 97.7 F (36.5 C) (Oral)   Resp 16   Ht 5\' 5"  (1.651 m)   Wt 190 lb (86.2 kg)   LMP 02/26/2016   SpO2 99%   BMI 31.62 kg/m   Visual Acuity Right Eye Distance:   Left Eye Distance:   Bilateral Distance:    Right Eye Near:   Left Eye Near:    Bilateral Near:     Physical Exam  Constitutional: She appears well-developed and well-nourished. No distress.  HENT:  Head: Normocephalic.  Right Ear: External ear normal.  Left Ear: External ear normal.  Nose: Nose normal.  Mouth/Throat: Oropharynx is clear and moist.  Eyes: Conjunctivae and EOM are normal. Pupils are equal, round, and reactive to light.  Neck: Neck supple.  Cardiovascular: Normal heart sounds.   Pulmonary/Chest: Effort normal and breath sounds normal. No respiratory distress. She has no wheezes. She has no rales. She exhibits no tenderness.  Abdominal: There is no tenderness.  Musculoskeletal: She exhibits no edema.  Lymphadenopathy:    She has no cervical adenopathy.  Neurological: She is alert.  Skin: Skin is warm and dry. No rash noted. She is not diaphoretic.  Nursing note and vitals reviewed.    UC Treatments / Results  Labs (all labs ordered are listed, but only abnormal results are displayed) Labs Reviewed - No data to display  EKG  EKG Interpretation None       Radiology No results found.  Procedures Procedures (including critical care time)  Medications Ordered in UC Medications - No data to display   Initial Impression / Assessment and Plan / UC Course  I have reviewed the triage vital signs and the nursing notes.  Pertinent labs & imaging results that were available during my care of the patient were reviewed by me and  considered in my medical decision making (see chart for details).    Patient reassured: Pulmonary injury unlikely with minimal exposure to chlorine gas released from cleaning agent.  Normal exam reassuring.  With family history of asthma, may possibly develop bronchospasm over next day or two. Advised to call PCP if increased symptoms develop (?begin prednisone taper). Increase fluid intake; stay well hydrated.  Avoid further inhalation of chemical fumes, smoke, etc.    Final Clinical Impressions(s) / UC Diagnoses   Final diagnoses:  Inhalation of cleaning agent, accidental or unintentional, initial encounter    New Prescriptions New Prescriptions   No medications on file  Lattie Haw, MD 03/06/16 [phone]

## 2016-02-28 NOTE — ED Triage Notes (Signed)
Pt reports that she mixed toilet bowl cleaner and bleach while cleaning about an hour ago; immediately followed by one episode of vomiting, SOB and center of CP. She reports that the SOB has resolved. She still c/o pain in the center of her chest.

## 2016-02-28 NOTE — Discharge Instructions (Signed)
Increase fluid intake; stay well hydrated.  Avoid further inhalation of chemical fumes, smoke, etc.

## 2016-03-30 ENCOUNTER — Other Ambulatory Visit: Payer: Self-pay | Admitting: Physician Assistant

## 2016-08-28 ENCOUNTER — Encounter: Payer: Self-pay | Admitting: Osteopathic Medicine

## 2016-08-28 ENCOUNTER — Ambulatory Visit (INDEPENDENT_AMBULATORY_CARE_PROVIDER_SITE_OTHER): Payer: Self-pay | Admitting: Osteopathic Medicine

## 2016-08-28 VITALS — BP 114/76 | HR 78 | Ht 65.0 in | Wt 186.0 lb

## 2016-08-28 DIAGNOSIS — F430 Acute stress reaction: Secondary | ICD-10-CM

## 2016-08-28 DIAGNOSIS — F329 Major depressive disorder, single episode, unspecified: Secondary | ICD-10-CM

## 2016-08-28 DIAGNOSIS — F4320 Adjustment disorder, unspecified: Secondary | ICD-10-CM

## 2016-08-28 DIAGNOSIS — F4321 Adjustment disorder with depressed mood: Secondary | ICD-10-CM

## 2016-08-28 DIAGNOSIS — F410 Panic disorder [episodic paroxysmal anxiety] without agoraphobia: Secondary | ICD-10-CM

## 2016-08-28 DIAGNOSIS — F32A Depression, unspecified: Secondary | ICD-10-CM

## 2016-08-28 MED ORDER — CLONAZEPAM 0.5 MG PO TABS: 0.2500 mg | ORAL_TABLET | Freq: Two times a day (BID) | ORAL | 0 refills | Status: DC | PRN

## 2016-08-28 MED ORDER — ESCITALOPRAM OXALATE 20 MG PO TABS: 20.0000 mg | ORAL_TABLET | Freq: Every day | ORAL | 3 refills | Status: DC

## 2016-08-28 NOTE — Progress Notes (Signed)
HPI: Erin Ashley is a 26 y.o. female  who [facility] [place], [place],  for chief complaint of:  Chief Complaint  Patient presents with  . Anxiety  . Depression    Had panic attack type symptoms at the beach when [place] her mother's ashes. Has been off her Lexapro for 5 days - was scared to take it. Would like to speak to a counselor but she does not have insurance. Her mom was under palliative care.   Patient is accompanied by sister.    Past medical history, surgical history, social history and family history reviewed.  Patient Active Problem List   Diagnosis Date Noted  . Constipation 10/11/2015  . Abdominal pain, left lower quadrant 10/11/2015  . Axillary mass 10/11/2015  . Mouth ulcers 06/30/2015  . Gastroesophageal reflux disease with esophagitis 06/30/2015  . Hearing impairment 05/04/2015  . 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 Pollen allergies 06/22/2014  . Anxiety state 07/01/2013  . Depression 07/01/2013    Current medication list and allergy/intolerance information reviewed.   Current Outpatient Prescriptions on File Prior to Visit  Medication Sig Dispense Refill  . escitalopram (LEXAPRO) 20 MG tablet Take 1 tablet (20 mg total) by mouth daily. 30 tablet 5  . escitalopram (LEXAPRO) 20 MG tablet TAKE ONE TABLET BY MOUTH ONCE DAILY 30 tablet 5  . fluticasone (FLONASE) 50 MCG/ACT nasal spray One spray in each nostril twice a day, use left hand for right nostril, and right hand for left nostril. 48 g 3  . TRI-ESTARYLLA 0.18/0.215/0.25 MG-35 MCG tablet TAKE 1 TABLET BY MOUTH DAILY. 84 tablet 4   No current facility-administered medications on file prior to visit.    Allergies  Allergen Reactions  . Amoxicillin Anaphylaxis  . Penicillins     REACTION: rash around mouth      Review of Systems:  Constitutional: No recent illness, physically feels well  HEENT: No  headache  Cardiac: No  chest pain, No   pressure, No palpitations - there she was having the symptoms at time of panic episode  Respiratory:  No  shortness of breath. No  Cough  Gastrointestinal: No  abdominal pain, +occasional nausea wit hanxiety  Neurologic: No  weakness, No  Dizziness  Psychiatric: +concerns with depression, +concerns with anxiety, +panic as per HPI   Exam:  BP 114/76   Pulse 78   Ht 5\' 5"  (1.651 m)   Wt 186 lb (84.4 kg)   BMI 30.95 kg/m   Constitutional: VS see above. General Appearance: alert, well-developed, well-nourished, NAD  Eyes: Normal lids and conjunctive, non-icteric sclera  Ears, Nose, Mouth, Throat: MMM, Normal external inspection ears/nares/mouth/lips/gums.  Neck: No masses, trachea midline.   Respiratory: Normal respiratory effort.  Musculoskeletal: Gait normal.   Neurological: Normal balance/coordination. No tremor.  Skin: warm, dry, intact.   Psychiatric: Normal judgment/insight. Normal mood and affect w/ some tearing up when talking about her mom. Oriented x3.        ASSESSMENT/PLAN: The primary encounter diagnosis was Grief reaction. Diagnoses of Panic attack as reaction to stress and Depression, unspecified depression type were also pertinent to this visit.  Outpatient Encounter Prescriptions as of 08/28/2016  Medication Sig  . escitalopram (LEXAPRO) 20 MG tablet Take 1 tablet (20 mg total) by mouth daily.  . fluticasone (FLONASE) 50 MCG/ACT nasal spray One spray in each nostril twice a day, use left hand for right nostril, and right hand for left nostril.  Samuel Jester. TRI-ESTARYLLA 0.18/0.215/0.25 MG-35  MCG tablet TAKE 1 TABLET BY MOUTH DAILY.  . [DISCONTINUED] escitalopram (LEXAPRO) 20 MG tablet TAKE ONE TABLET BY MOUTH ONCE DAILY  . clonazePAM (KLONOPIN) 0.5 MG tablet Take 0.5-1 tablets (0.25-0.5 mg total) by mouth 2 (two) times daily as needed (severe anxiety / panic. Use sparingly to prevent tolerance. No refills without office visit).  . [DISCONTINUED] escitalopram  (LEXAPRO) 20 MG tablet Take 1 tablet (20 mg total) by mouth daily.   No facility-administered encounter medications on file as of 08/28/2016.      Patient Instructions  Please contact palliative care for grief counseling Please restart Lexapro Prescription provided for Clonazepam for as-needed use for severe anxiety/panic Please call us if any additional questions or concerns!     Follow-up plan: Return if symptoms worsen or fail to improve, and as directed by PCP for routine care .  Visit summary with medication list and pertinent instructions was printed for patient to review, alert us if any changes needed. All questions at time of visit were answered - patient instructed to contact office with any additional concerns. ER/RTC precautions were reviewed with the patient and understanding verbalized.   Note: Total time spent 15 minutes, greater than 50% of the visit was spent face-to-face counseling and coordinating care for the following: The primary encounter diagnosis was Grief reaction. Diagnoses of Panic attack as reaction to stress and Depression, unspecified depression type were also pertinent to this visit..Marland Kitchen

## 2016-08-28 NOTE — Patient Instructions (Signed)
Please contact palliative care for grief counseling Please restart Lexapro Prescription provided for Clonazepam for as-needed use for severe anxiety/panic Please call us if any additional questions or concerns!

## 2016-11-20 ENCOUNTER — Ambulatory Visit (INDEPENDENT_AMBULATORY_CARE_PROVIDER_SITE_OTHER): Payer: Self-pay | Admitting: Physician Assistant

## 2016-11-20 ENCOUNTER — Encounter: Payer: Self-pay | Admitting: Physician Assistant

## 2016-11-20 VITALS — BP 124/80 | HR 78 | Temp 98.1°F | Wt 187.0 lb

## 2016-11-20 DIAGNOSIS — J00 Acute nasopharyngitis [common cold]: Secondary | ICD-10-CM

## 2016-11-20 DIAGNOSIS — M940 Chondrocostal junction syndrome [Tietze]: Secondary | ICD-10-CM

## 2016-11-20 MED ORDER — MELOXICAM 15 MG PO TABS: 15.0000 mg | ORAL_TABLET | Freq: Every day | ORAL | 0 refills | Status: DC

## 2016-11-20 MED ORDER — IPRATROPIUM BROMIDE 0.06 % NA SOLN: 1.0000 | Freq: Four times a day (QID) | NASAL | 0 refills | Status: DC | PRN
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
## 2016-11-20 NOTE — Patient Instructions (Signed)
-   Meloxicam 1 tab daily for 1 week, then daily as needed for chest pain - Heating pad 2-3 times daily for 20 minutes applied to area of chest discomfort - Sudafed daily as needed for sinus congestion - Atrovent nasal spray up to 4 times daily as needed for sinus congestion   Costochondritis Costochondritis is swelling and irritation (inflammation) of the tissue (cartilage) that connects your ribs to your breastbone (sternum). This causes pain in the front of your chest. Usually, the pain:  Starts gradually.  Is in more than one rib.  This condition usually goes away on its own over time. Follow these instructions at home:  Do not do anything that makes your pain worse.  If directed, put ice on the painful area: ? Put ice in a plastic bag. ? Place a towel between your skin and the bag. ? Leave the ice on for 20 minutes, 2-3 times a day.  If directed, put heat on the affected area as often as told by your doctor. Use the heat source that your doctor tells you to use, such as a moist heat pack or a heating pad. ? Place a towel between your skin and the heat source. ? Leave the heat on for 20-30 minutes. ? Take off the heat if your skin turns bright red. This is very important if you cannot feel pain, heat, or cold. You may have a greater risk of getting burned.  Take over-the-counter and prescription medicines only as told by your doctor.  Return to your normal activities as told by your doctor. Ask your doctor what activities are safe for you.  Keep all follow-up visits as told by your doctor. This is important. Contact a doctor if:  You have chills or a fever.  Your pain does not go away or it gets worse.  You have a cough that does not go away. Get help right away if:  You are short of breath. This information is not intended to replace advice given to you by your health care provider. Make sure you discuss any questions you have with your health care provider. Document  Released: 07/05/2007 Document Revised: 08/06/2015 Document Reviewed: 05/12/2015 Elsevier Interactive Patient Education  [employer]2018 [employer]

## 2016-11-22 ENCOUNTER — Telehealth: Payer: Self-pay | Admitting: *Deleted

## 2016-11-22 MED ORDER — ONDANSETRON HCL 8 MG PO TABS: 8.0000 mg | ORAL_TABLET | Freq: Three times a day (TID) | ORAL | 0 refills | Status: DC | PRN

## 2016-11-22 NOTE — Telephone Encounter (Signed)
We can also send over zofran 8mg  every 8 hours #20 for nausea

## 2016-11-22 NOTE — Telephone Encounter (Signed)
Patient states yesterday am and this morning the patient vomited. She states through out the day she felt nauseous. She was able to eat a plain potatoe and keep it down. She denies fever, abdominal pain or headache.She does have a lot of sinus drainage. Last LMP was approximately 2 weeks ago and per patient no chance of her being pregnant. I did advise the patient that she may have a viral GI bug or she could be getting nauseous from the sinus drainage she is having. Advised to continue to stay hydrated drinking small sips of fluid such as water, ginger ale, sprite and continue with BRAT diet. Also for the drainage she could try Mucinex. Advised and continue flonase. The mucinex can cause dry mouth so advised to really try to stay hydrated. If any new symptoms developed such as fever, abdominal pain, or vomiting gets worse or does not go away then call back to schedule appointment.

## 2016-11-22 NOTE — Telephone Encounter (Signed)
zofran sent. Tried to call patient back but vm was full

## 2016-11-23 ENCOUNTER — Encounter: Payer: Self-pay | Admitting: Physician Assistant

## 2016-11-23 ENCOUNTER — Ambulatory Visit (INDEPENDENT_AMBULATORY_CARE_PROVIDER_SITE_OTHER): Payer: Self-pay

## 2016-11-23 ENCOUNTER — Ambulatory Visit (INDEPENDENT_AMBULATORY_CARE_PROVIDER_SITE_OTHER): Payer: Self-pay | Admitting: Physician Assistant

## 2016-11-23 VITALS — BP 127/86 | HR 95 | Temp 98.4°F | Wt 177.0 lb

## 2016-11-23 DIAGNOSIS — K5900 Constipation, unspecified: Secondary | ICD-10-CM

## 2016-11-23 DIAGNOSIS — R1114 Bilious vomiting: Secondary | ICD-10-CM

## 2016-11-23 DIAGNOSIS — R1011 Right upper quadrant pain: Secondary | ICD-10-CM | POA: Insufficient documentation

## 2016-11-23 DIAGNOSIS — R1012 Left upper quadrant pain: Secondary | ICD-10-CM

## 2016-11-23 LAB — CBC WITH DIFFERENTIAL/PLATELET
Basophils Absolute: 40 cells/uL (ref 0–200)
Basophils Relative: 0.5 %
Eosinophils Absolute: 80 cells/uL (ref 15–500)
Eosinophils Relative: 1 %
HCT: 40.7 % (ref 35.0–45.0)
Hemoglobin: 13.6 g/dL (ref 11.7–15.5)
Lymphs Abs: 1136 cells/uL (ref 850–3900)
MCH: 27.4 pg (ref 27.0–33.0)
MCHC: 33.4 g/dL (ref 32.0–36.0)
MCV: 82.1 fL (ref 80.0–100.0)
MPV: 12.7 fL — ABNORMAL HIGH (ref 7.5–12.5)
Monocytes Relative: 6.1 %
Neutro Abs: 6256 cells/uL (ref 1500–7800)
Neutrophils Relative %: 78.2 %
Platelets: 241 10*3/uL (ref 140–400)
RBC: 4.96 10*6/uL (ref 3.80–5.10)
RDW: 13.9 % (ref 11.0–15.0)
Total Lymphocyte: 14.2 %
WBC mixed population: 488 cells/uL (ref 200–950)
WBC: 8 10*3/uL (ref 3.8–10.8)

## 2016-11-23 LAB — COMPREHENSIVE METABOLIC PANEL
AG RATIO: 1.5 (calc) (ref 1.0–2.5)
ALT: 41 U/L — AB (ref 6–29)
AST: 24 U/L (ref 10–30)
Albumin: 4.7 g/dL (ref 3.6–5.1)
Alkaline phosphatase (APISO): 77 U/L (ref 33–115)
BUN: 8 mg/dL (ref 7–25)
CHLORIDE: 104 mmol/L (ref 98–110)
CO2: 22 mmol/L (ref 20–32)
CREATININE: 0.77 mg/dL (ref 0.50–1.10)
Calcium: 9.6 mg/dL (ref 8.6–10.2)
GLOBULIN: 3.1 g/dL (ref 1.9–3.7)
GLUCOSE: 95 mg/dL (ref 65–99)
Potassium: 4 mmol/L (ref 3.5–5.3)
SODIUM: 137 mmol/L (ref 135–146)
TOTAL PROTEIN: 7.8 g/dL (ref 6.1–8.1)
Total Bilirubin: 0.5 mg/dL (ref 0.2–1.2)

## 2016-11-23 LAB — LIPASE: Lipase: 14 U/L (ref 7–60)

## 2016-11-23 LAB — POCT URINE PREGNANCY: Preg Test, Ur: NEGATIVE

## 2016-11-23 MED ORDER — ONDANSETRON HCL 4 MG PO TABS
4.0000 mg | ORAL_TABLET | Freq: Once | ORAL | Status: AC
  Administered 2016-11-23: 4 mg via ORAL

## 2016-11-23 NOTE — Patient Instructions (Signed)
- X-ray and labs today - Urine pregnancy test was negative - Pick up zofran from pharmacy: dissolve 1 tab under the tongue every 8 hours as needed for nausea/vomiting - Clear liquid diet for the next 12-24 hours. Then introduce a bland diet   Clear Liquid Diet, Adult A clear liquid diet is a diet that includes only liquids that you can see through. You may need to follow a clear liquid diet if:  You develop a medical condition right before or after you have surgery.  You were not able to eat food for a long period of time.  You had a condition that gave you diarrhea.  You are going to have an exam, such as a colonoscopy, in which instruments will be put into your body to look at parts of your digestive system.  You are going to have bowel surgery.  The usual goals of this diet are:  To rest the stomach and digestive system as much as possible.  To keep you hydrated.  To make sure you get some calories for energy.  To help you return to normal digestion.  Most people need to follow this diet for only a short period of time. What do I need to know about this diet?  A clear liquid is a liquid that you can see through when you hold it up to a light.  A clear liquid diet does not provide all the nutrients that you need. It is important to choose a variety of the liquids that are allowed on this diet. That way, you will get as many nutrients as possible.  If you are not sure whether you can have certain items, ask your health care provider. What can I have?  Water and flavored water.  Fruit juices that do not have pulp, such as cranberry juice and apple juice.  Tea and coffee without milk or cream.  Clear bouillon or broth.  Broth-based soups that have been strained.  Flavored gelatins.  Honey.  Sugar water.  Frozen ice or frozen ice pops that do not contain milk, yogurt, fruit pieces, or fruit pulp.  Clear sodas.  Clear sports drinks. The items listed above may  not be a complete list of recommended liquids. Contact your dietitian for more options. What can I not have?  Juices that have pulp.  Milk.  Cream or cream-based soups.  Yogurt. The items listed above may not be a complete list of liquids to avoid. Contact your dietitian for more information. Summary  A clear liquid diet is a diet that includes only liquids that you can see through.  The goal of this diet is to help you recover by resting your digestive system, keeping you hydrated, and providing nutrients.  Make sure to avoid liquids with milk, cream, or pulp while on this diet. This information is not intended to replace advice given to you by your health care provider. Make sure you discuss any questions you have with your health care provider. Document Released: 01/16/2005 Document Revised: 08/31/2015 Document Reviewed: 12/13/2012 Elsevier Interactive Patient Education  2018 [employer]   [place] Diet A bland diet consists of foods that do not have a lot of fat or fiber. Foods without fat or fiber are easier for the body to digest. They are also less likely to irritate your mouth, throat, stomach, and other parts of your gastrointestinal tract. A bland diet is sometimes called a BRAT diet. What is my plan? Your health care provider or dietitian may  recommend specific changes to your diet to prevent and treat your symptoms, such as:  Eating small meals often.  Cooking food until it is soft enough to chew easily.  Chewing your food well.  Drinking fluids slowly.  Not eating foods that are very spicy, sour, or fatty.  Not eating citrus fruits, such as oranges and grapefruit.  What do I need to know about this diet?  Eat a variety of foods from the bland diet food list.  Do not follow a bland diet longer than you have to.  Ask your health care provider whether you should take vitamins. What foods can I eat? Grains  Hot cereals, such as cream of wheat. Bread,  crackers, or tortillas made from refined white flour. Rice. Vegetables Canned or cooked vegetables. Mashed or boiled potatoes. Fruits Bananas. Applesauce. Other types of cooked or canned fruit with the skin and seeds removed, such as canned peaches or pears. Meats and Other Protein Sources Scrambled eggs. Creamy peanut butter or other nut butters. Lean, well-cooked meats, such as chicken or fish. Tofu. Soups or broths. Dairy Low-fat dairy products, such as milk, cottage cheese, or yogurt. Beverages Water. Herbal tea. Apple juice. Sweets and Desserts Pudding. Custard. Fruit gelatin. Ice cream. Fats and Oils Mild salad dressings. Canola or olive oil. The items listed above may not be a complete list of allowed foods or beverages. Contact your dietitian for more options. What foods are not recommended? Foods and ingredients that are often not recommended include:  Spicy foods, such as hot sauce or salsa.  Fried foods.  Sour foods, such as pickled or fermented foods.  Raw vegetables or fruits, especially citrus or berries.  Caffeinated drinks.  Alcohol.  Strongly flavored seasonings or condiments.  The items listed above may not be a complete list of foods and beverages that are not allowed. Contact your dietitian for more information. This information is not intended to replace advice given to you by your health care provider. Make sure you discuss any questions you have with your health care provider. Document Released: 05/10/2015 Document Revised: 06/24/2015 Document Reviewed: 01/28/2014 Elsevier Interactive Patient Education  2018 [employer].

## 2016-11-23 NOTE — Progress Notes (Signed)
HPI:                                                                Erin Ashley is a 26 y.o. female who [facility] [place] today for nausea and vomiting  Patient with PMH significant for GERD and constipation presents today with nausea and vomiting x 4 days. Emesis appears to be "clear drainage." Reports she had dry heaves early this morning around 6am. Denies fever, abdominal pain, or diarrhea. Denies coffee ground or hematemesis. Endorses chills, malaise, constipation. Last BM over 1 week ago, passing flatus. Also reports sinus drainage, denies cough or congestion.  Reports she was last sexually active 2-3 weeks ago. LMP approx 2 weeks ago. She is on OCP's. Denies missed pills.   Past Medical History:  Diagnosis Date  . GERD (gastroesophageal reflux disease)    No past surgical history on file. Social History  Substance Use Topics  . Smoking status: Never Smoker  . Smokeless tobacco: Never Used  . Alcohol use Yes   family history includes Diabetes in her father; Hyperlipidemia in her father, paternal grandfather, and paternal grandmother; Hypertension in her father, paternal grandfather, and paternal grandmother.  ROS: negative except as noted in the HPI  Medications: Current Outpatient Prescriptions  Medication Sig Dispense Refill  . clonazePAM (KLONOPIN) 0.5 MG tablet Take 0.5-1 tablets (0.25-0.5 mg total) by mouth 2 (two) times daily as needed (severe anxiety / panic. Use sparingly to prevent tolerance. No refills without office visit). 10 tablet 0  . escitalopram (LEXAPRO) 20 MG tablet Take 1 tablet (20 mg total) by mouth daily. 90 tablet 3  . fluticasone (FLONASE) 50 MCG/ACT nasal spray One spray in each nostril twice a day, use left hand for right nostril, and right hand for left nostril. 48 g 3  . ipratropium (ATROVENT) 0.06 % nasal spray Place 1 spray into both nostrils 4 (four) times daily as needed. 15 mL 0  .  meloxicam (MOBIC) 15 MG tablet Take 1 tablet (15 mg total) by mouth daily. 30 tablet 0  . ondansetron (ZOFRAN) 8 MG tablet Take 1 tablet (8 mg total) by mouth every 8 (eight) hours as needed for nausea or vomiting. 20 tablet 0  . TRI-ESTARYLLA 0.18/0.215/0.25 MG-35 MCG tablet TAKE 1 TABLET BY MOUTH DAILY. 84 tablet 4   No current facility-administered medications for this visit.    Allergies  Allergen Reactions  . Amoxicillin Anaphylaxis  . Penicillins     REACTION: rash around mouth       Objective:  BP 127/86   Pulse 95   Temp 98.4 F (36.9 C) (Oral)   Wt 177 lb (80.3 kg)   LMP 11/09/2016 (Approximate)   SpO2 97%   BMI 29.45 kg/m  Gen:  alert, not ill-appearing, no distress, appropriate for age, overweight female HEENT: head normocephalic without obvious abnormality, conjunctiva and cornea clear, wearing glasses, no frontal or maxillary sinus tenderness, oropharynx clear, no PND, neck supple, no adenopathy, trachea midline Pulm: Normal work of breathing, normal phonation, clear to auscultation bilaterally, no wheezes, rales or rhonchi CV: Normal rate, regular rhythm, s1 and s2 distinct, no murmurs, clicks or rubs  GI: bowel sounds hypoactive, abdomen soft, tenderness across the upper abdomen and epigastrum,  no rebound, no guarding, negative Murphy's sign Neuro: alert and oriented x 3, no tremor MSK: extremities atraumatic, normal gait and station Skin: intact, no rashes on exposed skin, no jaundice, no cyanosis     Results for orders placed or performed in visit on 11/23/16 (from the past 72 hour(s))  POCT urine pregnancy     Status: Normal   Collection Time: 11/23/16 11:11 AM  Result Value Ref Range   Preg Test, Ur Negative Negative   No results found.    Assessment and Plan: 26 y.o. female with   1. Bilious vomiting with nausea - POCT urine pregnancy negative - She was given 1 dose of zofran here and completed PO challenge - x-ray to r/o obstruction.  Symptomatic management with zofran 4mg  Q8h. Clear liquid diet for the next 12-24 hours, then introduce a bland diet. - DG Abd 2 Views - Lipase - CBC with Differential/Platelet - Comprehensive metabolic panel - ondansetron (ZOFRAN) tablet 4 mg; Take 1 tablet (4 mg total) by mouth once.   2. Constipation, unspecified constipation type   3. Acute bilateral upper abdominal pain - DG Abd 2 Views - suspect this is a viral gastroenteritis. Symptomatic management and close follow-up  Patient education and anticipatory guidance given Patient agrees with treatment plan Follow-up in approximately 6 days or sooner as needed if symptoms worsen or fail to improve  Levonne Hubert PA-C

## 2016-11-24 ENCOUNTER — Encounter: Payer: Self-pay | Admitting: Physician Assistant

## 2016-11-24 DIAGNOSIS — R740 Nonspecific elevation of levels of transaminase and lactic acid dehydrogenase [LDH]: Secondary | ICD-10-CM

## 2016-11-24 DIAGNOSIS — R7401 Elevation of levels of liver transaminase levels: Secondary | ICD-10-CM | POA: Insufficient documentation

## 2016-11-24 NOTE — Progress Notes (Signed)
Abdominal x-ray is normal Liver enzyme is elevated - this is likely fatty liver disease. Recommend low fat diet, weight loss, and avoiding alcohol Recheck liver enzymes in 4 weeks Other labs look good

## 2016-11-29 ENCOUNTER — Encounter: Payer: Self-pay | Admitting: Physician Assistant

## 2016-11-29 ENCOUNTER — Ambulatory Visit (INDEPENDENT_AMBULATORY_CARE_PROVIDER_SITE_OTHER): Payer: Self-pay | Admitting: Physician Assistant

## 2016-11-29 VITALS — BP 109/78 | HR 92 | Ht 65.0 in | Wt 178.0 lb

## 2016-11-29 DIAGNOSIS — R1110 Vomiting, unspecified: Secondary | ICD-10-CM

## 2016-11-29 DIAGNOSIS — K2100 Gastro-esophageal reflux disease with esophagitis, without bleeding: Secondary | ICD-10-CM

## 2016-11-29 DIAGNOSIS — R110 Nausea: Secondary | ICD-10-CM | POA: Insufficient documentation

## 2016-11-29 MED ORDER — ONDANSETRON HCL 8 MG PO TABS: 8.0000 mg | ORAL_TABLET | Freq: Three times a day (TID) | ORAL | 0 refills | Status: DC | PRN

## 2016-11-29 MED ORDER — OMEPRAZOLE 40 MG PO CPDR: 40.0000 mg | DELAYED_RELEASE_CAPSULE | Freq: Every day | ORAL | 2 refills | Status: DC

## 2016-11-29 MED ORDER — RANITIDINE HCL 150 MG PO CAPS: 150.0000 mg | ORAL_CAPSULE | Freq: Every evening | ORAL | 2 refills | Status: DC

## 2016-11-29 NOTE — Patient Instructions (Signed)
Start zantac at bedtime.  Consider starting omeprazole in the morning.    Food Choices for Gastroesophageal Reflux Disease, Adult When you have gastroesophageal reflux disease (GERD), the foods you eat and your eating habits are very important. Choosing the right foods can help ease your discomfort. What guidelines do I need to follow?  Choose fruits, vegetables, whole grains, and low-fat dairy products.  Choose low-fat meat, fish, and poultry.  Limit fats such as oils, salad dressings, butter, nuts, and avocado.  Keep a food diary. This helps you identify foods that cause symptoms.  Avoid foods that cause symptoms. These may be different for everyone.  Eat small meals often instead of 3 large meals a day.  Eat your meals slowly, in a place where you are relaxed.  Limit fried foods.  Cook foods using methods other than frying.  Avoid drinking alcohol.  Avoid drinking large amounts of liquids with your meals.  Avoid bending over or lying down until 2-3 hours after eating. What foods are not recommended? These are some foods and drinks that may make your symptoms worse: Vegetables Tomatoes. Tomato juice. Tomato and spaghetti sauce. Chili peppers. Onion and garlic. Horseradish. Fruits Oranges, grapefruit, and lemon (fruit and juice). Meats High-fat meats, fish, and poultry. This includes hot dogs, ribs, ham, sausage, salami, and bacon. Dairy Whole milk and chocolate milk. Sour cream. Cream. Butter. Ice cream. Cream cheese. Drinks Coffee and tea. Bubbly (carbonated) drinks or energy drinks. Condiments Hot sauce. Barbecue sauce. Sweets/Desserts Chocolate and cocoa. Donuts. Peppermint and spearmint. Fats and Oils High-fat foods. This includes [place] fries and potato chips. Other Vinegar. Strong spices. This includes black pepper, white pepper, red pepper, cayenne, curry powder, cloves, ginger, and chili powder. The items listed above may not be a complete list of foods  and drinks to avoid. Contact your dietitian for more information. This information is not intended to replace advice given to you by your health care provider. Make sure you discuss any questions you have with your health care provider. Document Released: 07/18/2011 Document Revised: 06/24/2015 Document Reviewed: 11/20/2012 Elsevier Interactive Patient Education  2017 [employer]

## 2016-11-29 NOTE — Progress Notes (Signed)
Subjective:    Patient ID: Erin Ashley, female    DOB: Oct 25, 1990, 26 y.o.   MRN: [id number]  HPI  Patient is a 26 year old pleasant female who presents to the clinic for one-week follow-up on upper abdominal pain, nausea and vomiting.  She did have an x-ray that revealed no bowel obstruction.  Her labs were essentially normal.  Zofran has helped her with nausea.  She feels like her symptoms are a lot better.  She still is having some intermittent dry heaving in the mornings only she feels fine for the rest of the day..  She does take Zofran for this and it does help.  She admits a history of GERD.  She is not currently on any medications.  She has been on medications in the past.  She has a history of constipation but she feels like her stools have been more soft and frequent.  She denies any worsening of abdominal pain with eating or drinking.  Patient denies any possibility of being pregnant.  She is on her menstrual cycle today.  She did have a negative urine pregnancy test at last visit.  She is on her OCP and has not missed any doses.  .. Active Ambulatory Problems    Diagnosis Date Noted  . Anxiety state 07/01/2013  . Depression 07/01/2013  . Pollen allergies 06/22/2014  . Hearing impairment 05/04/2015  . 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 Mouth ulcers 06/30/2015  . Gastroesophageal reflux disease with esophagitis 06/30/2015  . Constipation 10/11/2015  . Abdominal pain, left lower quadrant 10/11/2015  . Axillary mass 10/11/2015  . Acute bilateral upper abdominal pain 11/23/2016  . Elevated ALT measurement 11/24/2016  . Dry heaves 11/29/2016  . Nausea 11/29/2016   Resolved Ambulatory Problems    Diagnosis Date Noted  . Acute maxillary sinusitis 06/12/2014   Past Medical History:  Diagnosis Date  . GERD (gastroesophageal reflux disease)       Review of Systems  Constitutional: Negative.   HENT: Negative.   Respiratory: Negative.   Genitourinary: Negative.         Objective:   Physical Exam  Constitutional: She is oriented to person, place, and time. She appears well-developed and well-nourished.  Cardiovascular: Normal rate, regular rhythm and normal heart sounds.   Pulmonary/Chest: Effort normal and breath sounds normal.  Abdominal: Soft. Bowel sounds are normal. There is no rebound and no guarding.  Mild diffuse tenderness over epigastric area.  Negative murphys sign.  No rebound or guarding.   Neurological: She is alert and oriented to person, place, and time.  Psychiatric: She has a normal mood and affect. Her behavior is normal.          Assessment & Plan:  Marland KitchenMarland KitchenDiagnoses and all orders for this visit:  Gastroesophageal reflux disease with esophagitis -     omeprazole (PRILOSEC) 40 MG capsule; Take 1 capsule (40 mg total) by mouth daily. -     ranitidine (ZANTAC) 150 MG capsule; Take 1 capsule (150 mg total) by mouth every evening.  Nausea -     omeprazole (PRILOSEC) 40 MG capsule; Take 1 capsule (40 mg total) by mouth daily. -     ranitidine (ZANTAC) 150 MG capsule; Take 1 capsule (150 mg total) by mouth every evening. -     ondansetron (ZOFRAN) 8 MG tablet; Take 1 tablet (8 mg total) by mouth every 8 (eight) hours as needed for nausea or vomiting.  Dry heaves -     omeprazole (PRILOSEC) 40 MG  capsule; Take 1 capsule (40 mg total) by mouth daily. -     ranitidine (ZANTAC) 150 MG capsule; Take 1 capsule (150 mg total) by mouth every evening.   I do feel like symptoms are most consistent with acid reflux as being the cause.  She is not on any acid reflux medication.  I would like to start ranitidine at bedtime.  If no significant improvement try Prilosec in the morning.  I would like for her to give this 2-3 weeks.  Also gave her a list of a GERD diet.  I refilled her Zofran for as needed for dry heaving.  If no improvement we could consider abdominal ultrasound.  She had no physical exam findings suggestive for gallbladder  etiology nor does her history seem prevalent for any type of exacerbation with food.  Follow up in 4 weeks.

## 2016-12-06 ENCOUNTER — Other Ambulatory Visit: Payer: Self-pay | Admitting: Physician Assistant

## 2016-12-06 DIAGNOSIS — J00 Acute nasopharyngitis [common cold]: Secondary | ICD-10-CM

## 2017-01-08 ENCOUNTER — Other Ambulatory Visit: Payer: Self-pay | Admitting: Physician Assistant

## 2017-01-31 ENCOUNTER — Ambulatory Visit (INDEPENDENT_AMBULATORY_CARE_PROVIDER_SITE_OTHER): Payer: Self-pay | Admitting: Physician Assistant

## 2017-01-31 ENCOUNTER — Encounter: Payer: Self-pay | Admitting: Physician Assistant

## 2017-01-31 ENCOUNTER — Ambulatory Visit (INDEPENDENT_AMBULATORY_CARE_PROVIDER_SITE_OTHER): Payer: Self-pay

## 2017-01-31 VITALS — BP 134/91 | HR 94 | Ht 65.0 in | Wt 163.0 lb

## 2017-01-31 DIAGNOSIS — R5383 Other fatigue: Secondary | ICD-10-CM

## 2017-01-31 DIAGNOSIS — R1011 Right upper quadrant pain: Secondary | ICD-10-CM

## 2017-01-31 DIAGNOSIS — R112 Nausea with vomiting, unspecified: Secondary | ICD-10-CM

## 2017-01-31 DIAGNOSIS — K828 Other specified diseases of gallbladder: Secondary | ICD-10-CM

## 2017-01-31 DIAGNOSIS — F341 Dysthymic disorder: Secondary | ICD-10-CM

## 2017-01-31 DIAGNOSIS — K2100 Gastro-esophageal reflux disease with esophagitis, without bleeding: Secondary | ICD-10-CM

## 2017-01-31 NOTE — Patient Instructions (Signed)
Suicidal Feelings: How to Help Yourself  Suicide is the taking of one's own life. If you feel as though life is getting too tough to handle and are thinking about suicide, get help right away. To get help:  Call your local emergency services (911 in the [place]  Call a suicide hotline to speak with a trained counselor who understands how you are feeling. The following is a list of suicide hotlines in the [place]. For a list of hotlines in [place], visit www.suicide.org/hotlines/international/[place]-suicide-hotlines.html.  1-800-273-TALK (1-800-273-8255).  1-800-SUICIDE (1-800-784-2433).  1-888-628-9454. This is a hotline for Spanish speakers.  1-800-799-4TTY (1-800-799-4889). This is a hotline for TTY users.  1-866-4-U-TREVOR (1-866-488-7386). This is a hotline for lesbian, gay, bisexual, transgender, or questioning youth.  Contact a crisis center or a local suicide prevention center. To find a crisis center or suicide prevention center:  Call your local hospital, clinic, community service organization, mental health center, social service provider, or health department. Ask for assistance in connecting to a crisis center.  Visit www.suicidepreventionlifeline.org/getinvolved/locator for a list of crisis centers in the [place], or visit www.suicideprevention.[place]/thinking-about-suicide/find-a-crisis-centre for a list of centers in [place].  Visit the following websites:  National Suicide Prevention Lifeline: www.suicidepreventionlifeline.org  Hopeline: www.hopeline.com  American Foundation for Suicide Prevention: www.afsp.org  The Trevor Project (for lesbian, gay, bisexual, transgender, or questioning youth): www.thetrevorproject.org    How can I help myself feel better?  Promise yourself that you will not do anything drastic when you have suicidal feelings. Remember, there is hope. Many people have gotten through suicidal thoughts and feelings, and you will, too. You may have gotten through them before, and  this proves that you can get through them again.  Let family, friends, teachers, or counselors know how you are feeling. Try not to isolate yourself from those who care about you. Remember, they will want to help you. Talk with someone every day, even if you do not feel sociable. Face-to-face conversation is best.  Call a mental health professional and see one regularly.  Visit your primary health care provider every year.  Eat a well-balanced diet, and space your meals so you eat regularly.  Get plenty of rest.  Avoid alcohol and drugs, and remove them from your home. They will only make you feel worse.  If you are thinking of taking a lot of medicine, give your medicine to someone who can give it to you one day at a time. If you are on antidepressants and are concerned you will overdose, let your health care provider know so he or she can give you safer medicines. Ask your mental health professional about the possible side effects of any medicines you are taking.  Remove weapons, poisons, knives, and anything else that could harm you from your home.  Try to stick to routines. Follow a schedule every day. Put self-care on your schedule.  Make a list of realistic goals, and cross them off when you achieve them. Accomplishments give a sense of worth.  Wait until you are feeling better before doing the things you find difficult or unpleasant.  Exercise if you are able. You will feel better if you exercise for even a half hour each day.  Go out in the sun or into nature. This will help you recover from depression faster. If you have a favorite place to walk, go there.  Do the things that have always given you pleasure. Play your favorite music, read a good book, paint a picture, play your favorite   instrument, or do anything else that takes your mind off your depression if it is safe to do.  Keep your living space well lit.  When you are feeling well, write yourself a letter about tips and support that you can read when  you are not feeling well.  Remember that life's difficulties can be sorted out with help. Conditions can be treated. You can work on thoughts and strategies that serve you well.  This information is not intended to replace advice given to you by your health care provider. Make sure you discuss any questions you have with your health care provider.  Document Released: 07/23/2002 Document Revised: 09/15/2015 Document Reviewed: 05/13/2013  Elsevier Interactive Patient Education  2018 [employer]

## 2017-01-31 NOTE — Progress Notes (Signed)
Call pt: gallbladder sludge noted. This could be causing some of your symptoms. There is indication to consider removal. Would you like to be referred to surgeon.

## 2017-01-31 NOTE — Progress Notes (Signed)
Subjective:    Patient ID: Erin Ashley, female    DOB: 11/04/1990, 27 y.o.   MRN: [id number]  HPI  Pt is a 27 yo pleasant female with a hx of GERD and depression comes in to follow up of these things. She is accompanied by her grandmother.   Her GERD symptoms have been terrible since fall of 2018. She is off an on nauseated and has bad taste in mouth. At times she will eat foods like collards, capri suns and just throw them back up. If she does not throw them back up then she feels nauseated. She has episodic pain in upper abdomen. No fever, chills, body aches. She does feel like it is worse around her cycle. She is taking omeprazole daily and zantac bid.   She is also feeling a lot of more and not motivated to do anything. She has no energy. She is not sleeping well. Her mother died last year. She is now primary care giver of her 26 yo sister. She lost her job for calling out too many times. She feels sad a lot. She denies any SI/HC. She was in grief counseling but stopped. She was taking lexapro but stopped.   .. Active Ambulatory Problems    Diagnosis Date Noted  . Anxiety state 07/01/2013  . Depression 07/01/2013  . Pollen allergies 06/22/2014  . Hearing impairment 05/04/2015  . 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 Mouth ulcers 06/30/2015  . Gastroesophageal reflux disease with esophagitis 06/30/2015  . Constipation 10/11/2015  . Abdominal pain, left lower quadrant 10/11/2015  . Axillary mass 10/11/2015  . Acute bilateral upper abdominal pain 11/23/2016  . Elevated ALT measurement 11/24/2016  . Dry heaves 11/29/2016  . Nausea 11/29/2016  . Gallbladder sludge 02/02/2017  . No energy 02/02/2017   Resolved Ambulatory Problems    Diagnosis Date Noted  . Acute maxillary sinusitis 06/12/2014   Past Medical History:  Diagnosis Date  . GERD (gastroesophageal reflux disease)        Review of Systems    see HPI.  Objective:   Physical Exam  Constitutional: She is  oriented to person, place, and time. She appears well-developed and well-nourished.  HENT:  Head: Normocephalic and atraumatic.  Cardiovascular: Normal rate, regular rhythm and normal heart sounds.  Pulmonary/Chest: Effort normal and breath sounds normal.  Abdominal: Soft. Bowel sounds are normal. She exhibits no distension and no mass. There is tenderness. There is no rebound and no guarding.  Tenderness over RUQ and epigastric area. No guarding or rebound.   Neurological: She is alert and oriented to person, place, and time.  Psychiatric: She has a normal mood and affect. Her behavior is normal.          Assessment & Plan:  Marland KitchenMarland KitchenDylin was seen today for anxiety, depression and gastroesophageal reflux.  Diagnoses and all orders for this visit:  Gastroesophageal reflux disease with esophagitis  No energy -     TSH -     CBC -     Ferritin -     Vitamin D 1,25 dihydroxy -     B12  Dysthymia -     TSH -     CBC -     Ferritin -     Vitamin D 1,25 dihydroxy -     B12  Non-intractable vomiting with nausea, unspecified vomiting type -     US Abdomen Complete -     Ambulatory referral to General Surgery  RUQ pain -  US Abdomen Complete -     Ambulatory referral to General Surgery  Gallbladder sludge -     Ambulatory referral to General Surgery   .Marland Kitchen. [facility] 01/31/2017 05/05/2015  Decreased Interest 3 1  Down, Depressed, Hopeless 3 1  PHQ - 2 Score 6 2  Altered sleeping 3 0  Tired, decreased energy 3 0  Change in appetite 3 0  Feeling bad or failure about yourself  2 1  Trouble concentrating 2 0  Moving slowly or fidgety/restless 3 0  Suicidal thoughts 0 0  PHQ-9 Score 22 3  Difficult doing work/chores Not difficult at all -   .. GAD 7 : Generalized Anxiety Score 01/31/2017 05/05/2015  Nervous, Anxious, on Edge 3 0  Control/stop worrying 3 0  Worry too much - different things 3 0  Trouble relaxing 3 0  Restless 1 0  Easily annoyed or irritable 1 0   Afraid - awful might happen 1 0  Total GAD 7 Score 15 0  Anxiety Difficulty - Not difficult at all   Labs to look at reasons for no energy.   US of abdomen did show some sludge I think this could be causing her symptoms.  Referral placed for general surgery. Continue on omeprazole and zantac. If still not improved after considering surgery then will send to get EGD with GI.   Restart lexapro. Restart counseling. Follow up in 6 weeks. HO of what to do if starts to have suicidal thoughts. Follow up sooner if needed. konapin only as need for anxiety.

## 2017-02-02 ENCOUNTER — Telehealth: Payer: Self-pay | Admitting: Physician Assistant

## 2017-02-02 DIAGNOSIS — R1011 Right upper quadrant pain: Secondary | ICD-10-CM | POA: Insufficient documentation

## 2017-02-02 DIAGNOSIS — R5383 Other fatigue: Secondary | ICD-10-CM | POA: Insufficient documentation

## 2017-02-02 DIAGNOSIS — K828 Other specified diseases of gallbladder: Secondary | ICD-10-CM | POA: Insufficient documentation

## 2017-02-02 DIAGNOSIS — R112 Nausea with vomiting, unspecified: Secondary | ICD-10-CM | POA: Insufficient documentation

## 2017-02-02 NOTE — Telephone Encounter (Signed)
Hepatic function added with quest online.

## 2017-02-02 NOTE — Telephone Encounter (Signed)
Please add hepatic liver function to labs drawn 1/2. She had elevated liver enzymes and need to be reevaluated.

## 2017-02-03 LAB — CBC
HCT: 39.3 % (ref 35.0–45.0)
HEMOGLOBIN: 12.9 g/dL (ref 11.7–15.5)
MCH: 26.9 pg — AB (ref 27.0–33.0)
MCHC: 32.8 g/dL (ref 32.0–36.0)
MCV: 81.9 fL (ref 80.0–100.0)
MPV: 13.2 fL — ABNORMAL HIGH (ref 7.5–12.5)
Platelets: 207 10*3/uL (ref 140–400)
RBC: 4.8 10*6/uL (ref 3.80–5.10)
RDW: 13.8 % (ref 11.0–15.0)
WBC: 7.8 10*3/uL (ref 3.8–10.8)

## 2017-02-03 LAB — HEPATIC FUNCTION PANEL
AG RATIO: 1.8 (calc) (ref 1.0–2.5)
ALT: 8 U/L (ref 6–29)
AST: 12 U/L (ref 10–30)
Albumin: 4.6 g/dL (ref 3.6–5.1)
Alkaline phosphatase (APISO): 96 U/L (ref 33–115)
BILIRUBIN DIRECT: 0.2 mg/dL (ref 0.0–0.2)
BILIRUBIN INDIRECT: 0.6 mg/dL (ref 0.2–1.2)
BILIRUBIN TOTAL: 0.8 mg/dL (ref 0.2–1.2)
GLOBULIN: 2.6 g/dL (ref 1.9–3.7)
Total Protein: 7.2 g/dL (ref 6.1–8.1)

## 2017-02-03 LAB — FERRITIN: FERRITIN: 43 ng/mL (ref 10–154)

## 2017-02-03 LAB — VITAMIN D 1,25 DIHYDROXY
VITAMIN D 1, 25 (OH) TOTAL: 47 pg/mL (ref 18–72)
Vitamin D2 1, 25 (OH)2: <8 pg/mL
Vitamin D3 1, 25 (OH)2: 47 pg/mL

## 2017-02-03 LAB — VITAMIN B12: VITAMIN B 12: 347 pg/mL (ref 200–1100)

## 2017-02-03 LAB — TSH: TSH: 1.32 mIU/L

## 2017-02-07 ENCOUNTER — Ambulatory Visit: Payer: Self-pay | Admitting: Surgery

## 2017-02-23 NOTE — Patient Instructions (Addendum)
Earle Gellli Waguespack  02/23/2017   Your procedure is scheduled on: 03-08-17  [facility]   Follow signs to Short Stay on first floor at 530 AM  Call this number if you have problems the morning of surgery [phone]   Remember: Do not eat food or drink liquids :After Midnight.     Take these medicines the morning of surgery with A SIP OF WATER:LEXAPRO,  ZOFRAN IF NEEDED, FLONASE NASAL SPRAY, BIRTH CONTROL PILL,  OMEPRAZOLE (PRILOSEC) , ZYRTEC                               You may not have any metal on your body including hair pins and              piercings  Do not wear jewelry, make-up, lotions, powders or perfumes, deodorant             Do not wear nail polish.  Do not shave  48 hours prior to surgery.              Men may shave face and neck.   Do not bring valuables to the hospital. [facility] IS NOT             RESPONSIBLE   FOR VALUABLES.  Contacts, dentures or bridgework may not be worn into surgery.  Leave suitcase in the car. After surgery it may be brought to your room.                Please read over the following fact sheets you were given: _____________________________________________________________________             [facility] - Preparing for Surgery Before surgery, you can play an important role.  Because skin is not sterile, your skin needs to be as free of germs as possible.  You can reduce the number of germs on your skin by washing with CHG (chlorahexidine gluconate) soap before surgery.  CHG is an antiseptic cleaner which kills germs and bonds with the skin to continue killing germs even after washing. Please DO NOT use if you have an allergy to CHG or antibacterial soaps.  If your skin becomes reddened/irritated stop using the CHG and inform your nurse when you arrive at Short Stay. Do not shave (including legs and underarms) for at least 48 hours prior to the first CHG shower.  You may shave your  face/neck. Please follow these instructions carefully:  1.  Shower with CHG Soap the night before surgery and the  morning of Surgery.  2.  If you choose to wash your hair, wash your hair first as usual with your  normal  shampoo.  3.  After you shampoo, rinse your hair and body thoroughly to remove the  shampoo.                           4.  Use CHG as you would any other liquid soap.  You can apply chg directly  to the skin and wash                       Gently with a scrungie or clean washcloth.  5.  Apply the CHG Soap to your body ONLY FROM THE NECK DOWN.   Do  not use on face/ open                           Wound or open sores. Avoid contact with eyes, ears mouth and genitals (private parts).                       Wash face,  Genitals (private parts) with your normal soap.             6.  Wash thoroughly, paying special attention to the area where your surgery  will be performed.  7.  Thoroughly rinse your body with warm water from the neck down.  8.  DO NOT shower/wash with your normal soap after using and rinsing off  the CHG Soap.                9.  Pat yourself dry with a clean towel.            10.  Wear clean pajamas.            11.  Place clean sheets on your bed the night of your first shower and do not  sleep with pets. Day of Surgery : Do not apply any lotions/deodorants the morning of surgery.  Please wear clean clothes to the hospital/surgery center.  FAILURE TO FOLLOW THESE INSTRUCTIONS MAY RESULT IN THE CANCELLATION OF YOUR SURGERY PATIENT SIGNATURE_________________________________  NURSE SIGNATURE__________________________________  ________________________________________________________________________

## 2017-02-27 ENCOUNTER — Other Ambulatory Visit: Payer: Self-pay

## 2017-02-27 ENCOUNTER — Encounter (HOSPITAL_COMMUNITY): Payer: Self-pay

## 2017-02-27 ENCOUNTER — Encounter (HOSPITAL_COMMUNITY)
Admission: RE | Admit: 2017-02-27 | Discharge: 2017-02-27 | Disposition: A | Discharge status: Home / Self Care | Payer: Self-pay | Source: Ambulatory Visit | Attending: Surgery | Admitting: Surgery

## 2017-02-27 DIAGNOSIS — K828 Other specified diseases of gallbladder: Secondary | ICD-10-CM | POA: Insufficient documentation

## 2017-02-27 DIAGNOSIS — K811 Chronic cholecystitis: Secondary | ICD-10-CM | POA: Insufficient documentation

## 2017-02-27 DIAGNOSIS — R109 Unspecified abdominal pain: Secondary | ICD-10-CM | POA: Insufficient documentation

## 2017-02-27 DIAGNOSIS — Z01818 Encounter for other preprocedural examination: Secondary | ICD-10-CM | POA: Insufficient documentation

## 2017-02-27 HISTORY — DX: Chronic cholecystitis: K81.1

## 2017-02-27 HISTORY — DX: Depression, unspecified: F32.A

## 2017-02-27 HISTORY — DX: Headache, unspecified: R51.9

## 2017-02-27 HISTORY — DX: Anxiety disorder, unspecified: F41.9

## 2017-02-27 HISTORY — DX: Major depressive disorder, single episode, unspecified: F32.9

## 2017-02-27 HISTORY — DX: Headache: R51

## 2017-02-27 LAB — CBC
HCT: 40.5 % (ref 36.0–46.0)
HEMOGLOBIN: 13.4 g/dL (ref 12.0–15.0)
MCH: 28.4 pg (ref 26.0–34.0)
MCHC: 33.1 g/dL (ref 30.0–36.0)
MCV: 85.8 fL (ref 78.0–100.0)
PLATELETS: 230 10*3/uL (ref 150–400)
RBC: 4.72 MIL/uL (ref 3.87–5.11)
RDW: 14.3 % (ref 11.5–15.5)
WBC: 5.5 10*3/uL (ref 4.0–10.5)

## 2017-02-27 LAB — HCG, SERUM, QUALITATIVE: PREG SERUM: NEGATIVE

## 2017-02-28 ENCOUNTER — Ambulatory Visit (INDEPENDENT_AMBULATORY_CARE_PROVIDER_SITE_OTHER): Payer: Self-pay | Admitting: Physician Assistant

## 2017-02-28 ENCOUNTER — Encounter: Payer: Self-pay | Admitting: Physician Assistant

## 2017-02-28 VITALS — BP 124/68 | HR 82 | Ht 65.0 in | Wt 167.0 lb

## 2017-02-28 DIAGNOSIS — F341 Dysthymic disorder: Secondary | ICD-10-CM

## 2017-02-28 DIAGNOSIS — K811 Chronic cholecystitis: Secondary | ICD-10-CM | POA: Insufficient documentation

## 2017-02-28 DIAGNOSIS — F3342 Major depressive disorder, recurrent, in full remission: Secondary | ICD-10-CM

## 2017-02-28 DIAGNOSIS — K828 Other specified diseases of gallbladder: Secondary | ICD-10-CM

## 2017-02-28 DIAGNOSIS — F411 Generalized anxiety disorder: Secondary | ICD-10-CM

## 2017-02-28 MED ORDER — ESCITALOPRAM OXALATE 10 MG PO TABS: 10.0000 mg | ORAL_TABLET | Freq: Every day | ORAL | 3 refills | Status: DC

## 2017-02-28 NOTE — Progress Notes (Signed)
Subjective:    Patient ID: Erin Ashley, female    DOB: 1990/05/13, 27 y.o.   MRN: [id number]  HPI Pt is a 27 yo female who presents to the clinic on mood and RUQ pain.  Her depression and anxiety is much better. She is taking lexapro 10mg  daily and doing wonderful. She has a job and working full time. She is bowling again. She denies any SI/HC. She is 100 percent improved.   Cholecystectomy scheduled on 03/08/17 for chronic cholecystitis.   .. Active Ambulatory Problems    Diagnosis Date Noted  . Anxiety state 07/01/2013  . Depression 07/01/2013  . Pollen allergies 06/22/2014  . Hearing impairment 05/04/2015  . 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 Mouth ulcers 06/30/2015  . Gastroesophageal reflux disease with esophagitis 06/30/2015  . Constipation 10/11/2015  . Abdominal pain, left lower quadrant 10/11/2015  . Axillary mass 10/11/2015  . Acute bilateral upper abdominal pain 11/23/2016  . Elevated ALT measurement 11/24/2016  . Dry heaves 11/29/2016  . Nausea 11/29/2016  . Gallbladder sludge 02/02/2017  . No energy 02/02/2017  . Non-intractable vomiting with nausea 02/02/2017  . RUQ pain 02/02/2017  . Chronic cholecystitis 02/28/2017   Resolved Ambulatory Problems    Diagnosis Date Noted  . Acute maxillary sinusitis 06/12/2014   Past Medical History:  Diagnosis Date  . Anxiety   . Chronic cholecystitis   . Depression   . GERD (gastroesophageal reflux disease)   . Headache       Review of Systems  All other systems reviewed and are negative.      Objective:   Physical Exam  Constitutional: She is oriented to person, place, and time. She appears well-developed and well-nourished.  HENT:  Head: Normocephalic and atraumatic.  Cardiovascular: Normal rate, regular rhythm and normal heart sounds.  Pulmonary/Chest: Effort normal and breath sounds normal.  Abdominal:  Diffuse pain over RUQ and epigastric area. No guarding or rebound.   Neurological: She  is alert and oriented to person, place, and time.  Psychiatric: She has a normal mood and affect. Her behavior is normal.          Assessment & Plan:  Marland KitchenMarland KitchenMaryann was seen today for anxiety and depression.  Diagnoses and all orders for this visit:  Recurrent major depressive disorder, in full remission (HCC) -     escitalopram (LEXAPRO) 10 MG tablet; Take 1 tablet (10 mg total) by mouth daily.  Gallbladder sludge  Chronic cholecystitis  Dysthymia -     escitalopram (LEXAPRO) 10 MG tablet; Take 1 tablet (10 mg total) by mouth daily.  Anxiety state   .Marland Kitchen Depression screen [facility] 2/9 02/28/2017 01/31/2017 05/05/2015  Decreased Interest - 3 1  Down, Depressed, Hopeless 0 3 1  PHQ - 2 Score 0 6 2  Altered sleeping 0 3 0  Tired, decreased energy 0 3 0  Change in appetite 0 3 0  Feeling bad or failure about yourself  0 2 1  Trouble concentrating 0 2 0  Moving slowly or fidgety/restless 0 3 0  Suicidal thoughts 0 0 0  PHQ-9 Score 0 22 3  Difficult doing work/chores Not difficult at all Not difficult at all -   .. GAD 7 : Generalized Anxiety Score 02/28/2017 01/31/2017 05/05/2015  Nervous, Anxious, on Edge 0 3 0  Control/stop worrying 0 3 0  Worry too much - different things 0 3 0  Trouble relaxing 0 3 0  Restless 0 1 0  Easily annoyed or irritable 0  1 0  Afraid - awful might happen 0 1 0  Total GAD 7 Score 0 15 0  Anxiety Difficulty - - Not difficult at all    Refilled lexapro 10mg  for 6 months.

## 2017-03-04 ENCOUNTER — Encounter (HOSPITAL_COMMUNITY): Payer: Self-pay | Admitting: Surgery

## 2017-03-04 NOTE — H&P (Signed)
General Surgery [facility]  Earle GellAli Lassen DOB: 05-Dec-1990 Single / Language: [place] / Race: White Female   History of Present Illness   The patient is a 27 year old female who presents for evaluation of gall stones.  CC: RUQ abd pain, nausea, gallbladder sludge  Patient is referred by her primary care provider, Tandy GawJade Breeback, PA, for surgical evaluation of right upper quadrant abdominal pain and gallbladder sludge. Patient has had a 3-4 month history of intermittent nausea associated with food and right upper quadrant abdominal pain. This has become more frequent and more persistent. Patient has gastroesophageal reflux symptoms. She denies fevers or chills. Laboratory studies did show transient elevation of liver function test which subsequently returned to normal. Patient underwent ultrasound of the abdomen one week ago. This showed gallbladder sludge but no evidence of cholelithiasis or biliary dilatation. Patient is now referred to surgery for evaluation for cholecystectomy. Patient does have a family history of gallbladder disease in her mother at a young age. Her mother required cholecystectomy. Patient denies any history of jaundice or acholic stools. She denies any previous hepatobiliary or pancreatic disease. She has had no prior abdominal surgery.   Past Surgical History Oral Surgery  Tonsillectomy   Diagnostic Studies History Colonoscopy  never Mammogram  never Pap Smear  1-5 years ago  Allergies Amoxicillin *PENICILLINS*  Anaphylaxis. Allergies Reconciled   Medication History Tri-Previfem (0.18/0.215/0.25MG -35 MCG Tablet, Oral) Active. Lexapro (20MG  Tablet, Oral) Active. KlonoPIN (0.5MG  Tablet, Oral) Active. Fluticasone Propionate (50MCG/ACT Suspension, Nasal) Active. Ipratropium Bromide (0.06% Solution, Nasal) Active. Omeprazole (40MG  Capsule DR, Oral) Active. Zofran (8MG  Tablet, Oral) Active. Zantac (150MG  Tablet,  Oral) Active. Medications Reconciled  Social History Alcohol use  Occasional alcohol use. Caffeine use  Carbonated beverages, Coffee, Tea. No drug use  Tobacco use  Never smoker.  Family History  Diabetes Mellitus  Father. Hypertension  Father. Kidney Disease  Mother. Thyroid problems  Mother.  Pregnancy / Birth History Age at menarche  13 years. Contraceptive History  Oral contraceptives. Gravida  0 Para  0 Regular periods   Other Problems Anxiety Disorder  Chest pain  Depression  Gastroesophageal Reflux Disease   Review of Systems General Present- Fatigue. Not Present- Appetite Loss, Chills, Fever, Night Sweats, Weight Gain and Weight Loss. Skin Present- Dryness. Not Present- Change in Wart/Mole, Hives, Jaundice, New Lesions, Non-Healing Wounds, Rash and Ulcer. HEENT Present- Seasonal Allergies. Not Present- Earache, Hearing Loss, Hoarseness, Nose Bleed, Oral Ulcers, Ringing in the Ears, Sinus Pain, Sore Throat, Visual Disturbances, Wears glasses/contact lenses and Yellow Eyes. Respiratory Not Present- Bloody sputum, Chronic Cough, Difficulty Breathing, Snoring and Wheezing. Breast Not Present- Breast Mass, Breast Pain, Nipple Discharge and Skin Changes. Cardiovascular Present- Chest Pain and Rapid Heart Rate. Not Present- Difficulty Breathing Lying Down, Leg Cramps, Palpitations, Shortness of Breath and Swelling of Extremities. Gastrointestinal Present- Abdominal Pain, Constipation, Nausea and Vomiting. Not Present- Bloating, Bloody Stool, Change in Bowel Habits, Chronic diarrhea, Difficulty Swallowing, Excessive gas, Gets full quickly at meals, Hemorrhoids, Indigestion and Rectal Pain. Female Genitourinary Not Present- Frequency, Nocturia, Painful Urination, Pelvic Pain and Urgency. Musculoskeletal Present- Back Pain. Not Present- Joint Pain, Joint Stiffness, Muscle Pain, Muscle Weakness and Swelling of Extremities. Neurological Present- Headaches. Not  Present- Decreased Memory, Fainting, Numbness, Seizures, Tingling, Tremor, Trouble walking and Weakness. Psychiatric Present- Anxiety, Change in Sleep Pattern and Depression. Not Present- Bipolar, Fearful and Frequent crying. Endocrine Present- Hot flashes. Not Present- Cold Intolerance, Excessive Hunger, Hair Changes, Heat Intolerance and New Diabetes. Hematology  Not Present- Blood Thinners, Easy Bruising, Excessive bleeding, Gland problems, HIV and Persistent Infections.  Vitals  Weight: 168 lb Height: 65in Body Surface Area: 1.84 m Body Mass Index: 27.96 kg/m  Temp.: 97.19F  Pulse: 103 (Regular)  BP: 122/82 (Sitting, Left Arm, Standard)  Physical Exam  See vital signs recorded above  GENERAL APPEARANCE Development: normal Nutritional status: normal Gross deformities: none  SKIN Rash, lesions, ulcers: none Induration, erythema: none Nodules: none palpable  EYES Conjunctiva and lids: normal Pupils: equal and reactive Iris: normal bilaterally  EARS, NOSE, MOUTH, THROAT External ears: no lesion or deformity External nose: no lesion or deformity Hearing: grossly normal Lips: no lesion or deformity Dentition: normal for age Oral mucosa: moist  NECK Symmetric: yes Trachea: midline Thyroid: no palpable nodules in the thyroid bed  CHEST Respiratory effort: normal Retraction or accessory muscle use: no Breath sounds: normal bilaterally Rales, rhonchi, wheeze: none  CARDIOVASCULAR Auscultation: regular rhythm, normal rate Murmurs: none Pulses: carotid and radial pulse 2+ palpable Lower extremity edema: none Lower extremity varicosities: none  ABDOMEN Distension: none Masses: none palpable Tenderness: Mild right upper quadrant tenderness to palpation without guarding Hepatosplenomegaly: not present Hernia: not present  MUSCULOSKELETAL Station and gait: normal Digits and nails: no clubbing or cyanosis Muscle strength: grossly normal all  extremities Range of motion: grossly normal all extremities Deformity: none  LYMPHATIC Cervical: none palpable Supraclavicular: none palpable  PSYCHIATRIC Oriented to person, place, and time: yes Mood and affect: normal for situation Judgment and insight: appropriate for situation   Assessment & Plan  CHRONIC CHOLECYSTITIS (K81.1) GALLBLADDER SLUDGE (K82.8)  Pt Education - Pamphlet Given - Laparoscopic Gallbladder Surgery: discussed with patient and provided information.  Patient presents with symptomatic chronic cholecystitis and evidence of gallbladder dysfunction. She is accompanied by her grandmother. She is provided with written literature on gallbladder surgery to review at home.  Patient has had 3-4 months of symptoms of gastroesophageal reflux and right upper quadrant abdominal pain. She experiences nausea with meals. Ultrasound examination shows sludge in the gallbladder indicative of gallbladder dysfunction. She clinically has chronic cholecystitis. She has had a history of elevated liver function tests which have normalized.  Based on these findings, I have recommended proceeding with laparoscopic cholecystectomy with intraoperative cholangiography. We have discussed the procedure. I provided her with written literature describing the surgery. We discussed the possibility of conversion to open surgery. We discussed the benefit of intraoperative cholangiography. We discussed the hospital stay and the postoperative recovery to be expected. We discussed the possibility of persistent symptoms which may require further gastroenterology evaluation. Patient understands and wishes to proceed with surgery in the near future.  The risks and benefits of the procedure have been discussed at length with the patient. The patient understands the proposed procedure, potential alternative treatments, and the course of recovery to be expected. All of the patient's questions have  been answered at this time. The patient wishes to proceed with surgery.  Darnell Level, MD [facility] Surgery Office: [phone]

## 2017-03-07 NOTE — Anesthesia Preprocedure Evaluation (Signed)
Anesthesia Evaluation  Patient identified by MRN, date of birth, ID band Patient awake    Reviewed: Allergy & Precautions, NPO status , Patient's Chart, lab work & pertinent test results  Airway Mallampati: II  TM Distance: >3 FB Neck ROM: Full    Dental no notable dental hx.    Pulmonary neg pulmonary ROS,    Pulmonary exam normal breath sounds clear to auscultation       Cardiovascular negative cardio ROS Normal cardiovascular exam Rhythm:Regular Rate:Normal     Neuro/Psych  Headaches, PSYCHIATRIC DISORDERS Anxiety Depression    GI/Hepatic Neg liver ROS, GERD  ,  Endo/Other  negative endocrine ROS  Renal/GU negative Renal ROS     Musculoskeletal negative musculoskeletal ROS (+)   Abdominal   Peds  Hematology negative hematology ROS (+)   Anesthesia Other Findings   Reproductive/Obstetrics negative OB ROS                             Anesthesia Physical Anesthesia Plan  ASA: II  Anesthesia Plan: General   Post-op Pain Management:    Induction: Intravenous  PONV Risk Score and Plan: 4 or greater and Ondansetron, Dexamethasone, Midazolam and Scopolamine patch - Pre-op  Airway Management Planned: Oral ETT  Additional Equipment:   Intra-op Plan:   Post-operative Plan: Extubation in OR  Informed Consent: I have reviewed the patients History and Physical, chart, labs and discussed the procedure including the risks, benefits and alternatives for the proposed anesthesia with the patient or authorized representative who has indicated his/her understanding and acceptance.   Dental advisory given  Plan Discussed with: CRNA  Anesthesia Plan Comments:         Anesthesia Quick Evaluation

## 2017-03-08 ENCOUNTER — Other Ambulatory Visit: Payer: Self-pay

## 2017-03-08 ENCOUNTER — Encounter (HOSPITAL_COMMUNITY): Payer: Self-pay | Admitting: *Deleted

## 2017-03-08 ENCOUNTER — Ambulatory Visit (HOSPITAL_COMMUNITY): Payer: Self-pay | Admitting: Anesthesiology

## 2017-03-08 ENCOUNTER — Observation Stay (HOSPITAL_COMMUNITY)
Admission: RE | Admit: 2017-03-08 | Discharge: 2017-03-09 | Disposition: A | Discharge status: Home / Self Care | Payer: Self-pay | Source: Ambulatory Visit | Attending: Surgery | Admitting: Surgery

## 2017-03-08 ENCOUNTER — Ambulatory Visit (HOSPITAL_COMMUNITY): Payer: Self-pay

## 2017-03-08 ENCOUNTER — Encounter (HOSPITAL_COMMUNITY)
Admission: RE | Disposition: A | Discharge status: Home / Self Care | Payer: Self-pay | Source: Ambulatory Visit | Attending: Surgery

## 2017-03-08 DIAGNOSIS — R7989 Other specified abnormal findings of blood chemistry: Secondary | ICD-10-CM | POA: Insufficient documentation

## 2017-03-08 DIAGNOSIS — F419 Anxiety disorder, unspecified: Secondary | ICD-10-CM | POA: Insufficient documentation

## 2017-03-08 DIAGNOSIS — Z79899 Other long term (current) drug therapy: Secondary | ICD-10-CM | POA: Insufficient documentation

## 2017-03-08 DIAGNOSIS — K828 Other specified diseases of gallbladder: Secondary | ICD-10-CM | POA: Insufficient documentation

## 2017-03-08 DIAGNOSIS — F329 Major depressive disorder, single episode, unspecified: Secondary | ICD-10-CM | POA: Insufficient documentation

## 2017-03-08 DIAGNOSIS — K219 Gastro-esophageal reflux disease without esophagitis: Secondary | ICD-10-CM | POA: Insufficient documentation

## 2017-03-08 DIAGNOSIS — K811 Chronic cholecystitis: Principal | ICD-10-CM | POA: Insufficient documentation

## 2017-03-08 DIAGNOSIS — Z880 Allergy status to penicillin: Secondary | ICD-10-CM | POA: Insufficient documentation

## 2017-03-08 HISTORY — PX: CHOLECYSTECTOMY: SHX55

## 2017-03-08 SURGERY — LAPAROSCOPIC CHOLECYSTECTOMY WITH INTRAOPERATIVE CHOLANGIOGRAM
Anesthesia: General | Site: Abdomen

## 2017-03-08 MED ORDER — ROCURONIUM BROMIDE 10 MG/ML (PF) SYRINGE
PREFILLED_SYRINGE | INTRAVENOUS | Status: AC
  Filled 2017-03-08: qty 10

## 2017-03-08 MED ORDER — FENTANYL CITRATE (PF) 100 MCG/2ML IJ SOLN
25.0000 ug | INTRAMUSCULAR | Status: DC | PRN
  Administered 2017-03-08 (×3): 50 ug via INTRAVENOUS

## 2017-03-08 MED ORDER — FENTANYL CITRATE (PF) 100 MCG/2ML IJ SOLN
INTRAMUSCULAR | Status: DC | PRN
  Administered 2017-03-08: 100 ug via INTRAVENOUS
  Administered 2017-03-08 (×2): 50 ug via INTRAVENOUS

## 2017-03-08 MED ORDER — BUPIVACAINE-EPINEPHRINE (PF) 0.5% -1:200000 IJ SOLN
INTRAMUSCULAR | Status: AC
  Filled 2017-03-08: qty 30

## 2017-03-08 MED ORDER — ROCURONIUM BROMIDE 10 MG/ML (PF) SYRINGE
PREFILLED_SYRINGE | INTRAVENOUS | Status: DC | PRN
  Administered 2017-03-08: 50 mg via INTRAVENOUS

## 2017-03-08 MED ORDER — SCOPOLAMINE 1 MG/3DAYS TD PT72
MEDICATED_PATCH | TRANSDERMAL | Status: AC
  Filled 2017-03-08: qty 1

## 2017-03-08 MED ORDER — SCOPOLAMINE 1 MG/3DAYS TD PT72
MEDICATED_PATCH | TRANSDERMAL | Status: DC | PRN
  Administered 2017-03-08: 1 via TRANSDERMAL

## 2017-03-08 MED ORDER — NORGESTIM-ETH ESTRAD TRIPHASIC 0.18/0.215/0.25 MG-35 MCG PO TABS: 1.0000 | ORAL_TABLET | Freq: Every day | ORAL | Status: DC

## 2017-03-08 MED ORDER — MIDAZOLAM HCL 2 MG/2ML IJ SOLN
INTRAMUSCULAR | Status: AC
  Filled 2017-03-08: qty 2

## 2017-03-08 MED ORDER — CIPROFLOXACIN IN D5W 400 MG/200ML IV SOLN
400.0000 mg | INTRAVENOUS | Status: AC
  Administered 2017-03-08: 400 mg via INTRAVENOUS
  Filled 2017-03-08: qty 200

## 2017-03-08 MED ORDER — 0.9 % SODIUM CHLORIDE (POUR BTL) OPTIME
TOPICAL | Status: DC | PRN
  Administered 2017-03-08: 1000 mL

## 2017-03-08 MED ORDER — DEXAMETHASONE SODIUM PHOSPHATE 10 MG/ML IJ SOLN
INTRAMUSCULAR | Status: DC | PRN
  Administered 2017-03-08: 10 mg via INTRAVENOUS

## 2017-03-08 MED ORDER — IOPAMIDOL (ISOVUE-300) INJECTION 61%
INTRAVENOUS | Status: DC | PRN
  Administered 2017-03-08: 3.5 mL

## 2017-03-08 MED ORDER — ONDANSETRON HCL 4 MG/2ML IJ SOLN
INTRAMUSCULAR | Status: DC | PRN
  Administered 2017-03-08: 4 mg via INTRAVENOUS

## 2017-03-08 MED ORDER — PROMETHAZINE HCL 25 MG/ML IJ SOLN: 6.2500 mg | INTRAMUSCULAR | Status: DC | PRN

## 2017-03-08 MED ORDER — MIDAZOLAM HCL 5 MG/5ML IJ SOLN
INTRAMUSCULAR | Status: DC | PRN
  Administered 2017-03-08: 2 mg via INTRAVENOUS

## 2017-03-08 MED ORDER — FENTANYL CITRATE (PF) 100 MCG/2ML IJ SOLN
INTRAMUSCULAR | Status: AC
  Filled 2017-03-08: qty 2

## 2017-03-08 MED ORDER — LIDOCAINE 2% (20 MG/ML) 5 ML SYRINGE
INTRAMUSCULAR | Status: DC | PRN
  Administered 2017-03-08: 100 mg via INTRAVENOUS

## 2017-03-08 MED ORDER — KCL IN DEXTROSE-NACL 20-5-0.45 MEQ/L-%-% IV SOLN
INTRAVENOUS | Status: DC
  Administered 2017-03-08: 13:00:00 via INTRAVENOUS
  Filled 2017-03-08 (×2): qty 1000

## 2017-03-08 MED ORDER — SUGAMMADEX SODIUM 200 MG/2ML IV SOLN
INTRAVENOUS | Status: DC | PRN
  Administered 2017-03-08: 200 mg via INTRAVENOUS

## 2017-03-08 MED ORDER — HYDROCODONE-ACETAMINOPHEN 5-325 MG PO TABS
1.0000 | ORAL_TABLET | ORAL | Status: DC | PRN
  Administered 2017-03-08: 1 via ORAL
  Filled 2017-03-08: qty 1

## 2017-03-08 MED ORDER — ESCITALOPRAM OXALATE 10 MG PO TABS: 10.0000 mg | ORAL_TABLET | Freq: Every day | ORAL | Status: DC

## 2017-03-08 MED ORDER — MEPERIDINE HCL 50 MG/ML IJ SOLN: 6.2500 mg | INTRAMUSCULAR | Status: DC | PRN

## 2017-03-08 MED ORDER — BUPIVACAINE-EPINEPHRINE 0.5% -1:200000 IJ SOLN
INTRAMUSCULAR | Status: DC | PRN
  Administered 2017-03-08: 20 mL

## 2017-03-08 MED ORDER — CHLORHEXIDINE GLUCONATE CLOTH 2 % EX PADS: 6.0000 | MEDICATED_PAD | Freq: Once | CUTANEOUS | Status: DC

## 2017-03-08 MED ORDER — IOPAMIDOL (ISOVUE-300) INJECTION 61%
INTRAVENOUS | Status: AC
  Filled 2017-03-08: qty 50

## 2017-03-08 MED ORDER — SUGAMMADEX SODIUM 200 MG/2ML IV SOLN
INTRAVENOUS | Status: AC
  Filled 2017-03-08: qty 4

## 2017-03-08 MED ORDER — GLYCOPYRROLATE 0.2 MG/ML IV SOSY
PREFILLED_SYRINGE | INTRAVENOUS | Status: DC | PRN
  Administered 2017-03-08: .2 mg via INTRAVENOUS

## 2017-03-08 MED ORDER — LACTATED RINGERS IV SOLN
INTRAVENOUS | Status: DC
  Administered 2017-03-08 (×2): via INTRAVENOUS

## 2017-03-08 MED ORDER — LACTATED RINGERS IR SOLN
Status: DC | PRN
  Administered 2017-03-08: 1000 mL

## 2017-03-08 MED ORDER — TRAMADOL HCL 50 MG PO TABS: 50.0000 mg | ORAL_TABLET | Freq: Four times a day (QID) | ORAL | Status: DC | PRN

## 2017-03-08 MED ORDER — PROPOFOL 10 MG/ML IV BOLUS
INTRAVENOUS | Status: DC | PRN
  Administered 2017-03-08: 150 mg via INTRAVENOUS
  Administered 2017-03-08: 40 mg via INTRAVENOUS

## 2017-03-08 MED ORDER — GLYCOPYRROLATE 0.2 MG/ML IV SOSY
PREFILLED_SYRINGE | INTRAVENOUS | Status: AC
  Filled 2017-03-08: qty 5

## 2017-03-08 MED ORDER — ACETAMINOPHEN 650 MG RE SUPP: 650.0000 mg | Freq: Four times a day (QID) | RECTAL | Status: DC | PRN

## 2017-03-08 MED ORDER — LIDOCAINE 2% (20 MG/ML) 5 ML SYRINGE
INTRAMUSCULAR | Status: AC
  Filled 2017-03-08: qty 10

## 2017-03-08 MED ORDER — IPRATROPIUM BROMIDE 0.06 % NA SOLN
1.0000 | NASAL | Status: DC | PRN
  Filled 2017-03-08: qty 15

## 2017-03-08 MED ORDER — ACETAMINOPHEN 325 MG PO TABS: 650.0000 mg | ORAL_TABLET | Freq: Four times a day (QID) | ORAL | Status: DC | PRN

## 2017-03-08 MED ORDER — ONDANSETRON HCL 4 MG/2ML IJ SOLN: 4.0000 mg | Freq: Four times a day (QID) | INTRAMUSCULAR | Status: DC | PRN

## 2017-03-08 MED ORDER — PROPOFOL 10 MG/ML IV BOLUS
INTRAVENOUS | Status: AC
  Filled 2017-03-08: qty 40

## 2017-03-08 MED ORDER — ONDANSETRON 4 MG PO TBDP: 4.0000 mg | ORAL_TABLET | Freq: Four times a day (QID) | ORAL | Status: DC | PRN

## 2017-03-08 MED ORDER — HYDROMORPHONE HCL 1 MG/ML IJ SOLN: 1.0000 mg | INTRAMUSCULAR | Status: DC | PRN

## 2017-03-08 SURGICAL SUPPLY — 31 items
APPLIER CLIP ROT 10 11.4 M/L (STAPLE) ×3
CABLE HIGH FREQUENCY MONO STRZ (ELECTRODE) ×3 IMPLANT
CHLORAPREP W/TINT 26ML (MISCELLANEOUS) ×6 IMPLANT
CLIP APPLIE ROT 10 11.4 M/L (STAPLE) ×1 IMPLANT
CLOSURE WOUND 1/2 X4 (GAUZE/BANDAGES/DRESSINGS) ×1
COVER MAYO STAND STRL (DRAPES) ×3 IMPLANT
COVER SURGICAL LIGHT HANDLE (MISCELLANEOUS) ×3 IMPLANT
DECANTER SPIKE VIAL GLASS SM (MISCELLANEOUS) ×3 IMPLANT
DRAPE C-ARM 42X120 X-RAY (DRAPES) ×3 IMPLANT
ELECT REM PT RETURN 15FT ADLT (MISCELLANEOUS) ×3 IMPLANT
GAUZE SPONGE 2X2 8PLY STRL LF (GAUZE/BANDAGES/DRESSINGS) ×1 IMPLANT
GLOVE SURG ORTHO 8.0 STRL STRW (GLOVE) ×3 IMPLANT
GOWN STRL REUS W/TWL XL LVL3 (GOWN DISPOSABLE) ×6 IMPLANT
HEMOSTAT SURGICEL 4X8 (HEMOSTASIS) IMPLANT
KIT BASIN OR (CUSTOM PROCEDURE TRAY) ×3 IMPLANT
POUCH SPECIMEN RETRIEVAL 10MM (ENDOMECHANICALS) ×3 IMPLANT
SCISSORS METZENBAUM CVD 33 (INSTRUMENTS) ×3 IMPLANT
SET CHOLANGIOGRAPH MIX (MISCELLANEOUS) ×3 IMPLANT
SET IRRIG TUBING LAPAROSCOPIC (IRRIGATION / IRRIGATOR) ×3 IMPLANT
SLEEVE XCEL OPT CAN 5 100 (ENDOMECHANICALS) ×3 IMPLANT
SPONGE GAUZE 2X2 STER 10/PKG (GAUZE/BANDAGES/DRESSINGS) ×2
STRIP CLOSURE SKIN 1/2X4 (GAUZE/BANDAGES/DRESSINGS) ×2 IMPLANT
SUT MNCRL AB 4-0 PS2 18 (SUTURE) ×3 IMPLANT
TAPE CLOTH SURG 4X10 WHT LF (GAUZE/BANDAGES/DRESSINGS) ×3 IMPLANT
TOWEL OR 17X26 10 PK STRL BLUE (TOWEL DISPOSABLE) ×3 IMPLANT
TOWEL OR NON WOVEN STRL DISP B (DISPOSABLE) ×3 IMPLANT
TRAY LAPAROSCOPIC (CUSTOM PROCEDURE TRAY) ×3 IMPLANT
TROCAR BLADELESS OPT 5 100 (ENDOMECHANICALS) ×3 IMPLANT
TROCAR XCEL BLUNT TIP 100MML (ENDOMECHANICALS) ×3 IMPLANT
TROCAR XCEL NON-BLD 11X100MML (ENDOMECHANICALS) ×3 IMPLANT
TUBING INSUF HEATED (TUBING) IMPLANT

## 2017-03-08 NOTE — Anesthesia Procedure Notes (Signed)
Procedure Name: Intubation Date/Time: 03/08/2017 7:50 AM Performed by: Theodosia QuayKey, Nabil Bubolz, CRNA Pre-anesthesia Checklist: Patient identified, Emergency Drugs available, Suction available and Patient being monitored Patient Re-evaluated:Patient Re-evaluated prior to induction Oxygen Delivery Method: Circle System Utilized Preoxygenation: Pre-oxygenation with 100% oxygen Induction Type: IV induction Ventilation: Mask ventilation without difficulty Laryngoscope Size: Miller and 2 Grade View: Grade I Tube type: Oral Tube size: 7.0 mm Number of attempts: 1 Airway Equipment and Method: Stylet and Oral airway Placement Confirmation: ETT inserted through vocal cords under direct vision,  positive ETCO2 and breath sounds checked- equal and bilateral Secured at: 21 cm Tube secured with: Tape Dental Injury: Teeth and Oropharynx as per pre-operative assessment  Comments: Placed by Paschal DoppKisan Patel, SRNA

## 2017-03-08 NOTE — Anesthesia Postprocedure Evaluation (Signed)
Anesthesia Post Note  Patient: Erin Ashley  Procedure(s) Performed: LAPAROSCOPIC CHOLECYSTECTOMY WITH INTRAOPERATIVE CHOLANGIOGRAM (N/A Abdomen)     Patient location during evaluation: PACU Anesthesia Type: General Level of consciousness: sedated and patient cooperative Pain management: pain level controlled Vital Signs Assessment: post-procedure vital signs reviewed and stable Respiratory status: spontaneous breathing Cardiovascular status: stable Anesthetic complications: no    Last Vitals:  Vitals:   03/08/17 1228 03/08/17 1401  BP: 121/76 111/66  Pulse: 67 60  Resp: 20 20  Temp: 36.9 C 37 C  SpO2: 99% 100%    Last Pain:  Vitals:   03/08/17 1401  TempSrc: Oral  PainSc:                  Lewie LoronJohn Aleria Maheu

## 2017-03-08 NOTE — Interval H&P Note (Signed)
History and Physical Interval Note:  03/08/2017 7:12 AM  Erin Ashley  has presented today for surgery, with the diagnosis of ABDOMINAL PAIN, GALLBLADDER SLUDGE, CHRONIC CHOLCYSTITIS.  The various methods of treatment have been discussed with the patient and family. After consideration of risks, benefits and other options for treatment, the patient has consented to    Procedure(s): LAPAROSCOPIC CHOLECYSTECTOMY WITH INTRAOPERATIVE CHOLANGIOGRAM (N/A) as a surgical intervention .    The patient's history has been reviewed, patient examined, no change in status, stable for surgery.  I have reviewed the patient's chart and labs.  Questions were answered to the patient's satisfaction.    Darnell Levelodd Reginaldo Hazard, MD [facility] Office: [phone]    Erin Ashley MJudie Petit

## 2017-03-08 NOTE — Op Note (Signed)
Procedure Note  Pre-operative Diagnosis:  Chronic cholecystitis, gallbladder sludge, abdominal pain  Post-operative Diagnosis:  same  Surgeon:  Darnell Level, MD  Assistant:  Manus Rudd, MD   Procedure:  Laparoscopic cholecystectomy with intra-operative cholangiography  Anesthesia:  General  Estimated Blood Loss:  minimal  Drains: none         Specimen: Gallbladder to pathology  Indications:  Patient is referred by her primary care provider, Tandy Gaw, PA, for surgical evaluation of right upper quadrant abdominal pain and gallbladder sludge. Patient has had a 3-4 month history of intermittent nausea associated with food and right upper quadrant abdominal pain. This has become more frequent and more persistent. Patient has gastroesophageal reflux symptoms. She denies fevers or chills. Laboratory studies did show transient elevation of liver function test which subsequently returned to normal. Patient underwent ultrasound of the abdomen one week ago. This showed gallbladder sludge but no evidence of cholelithiasis or biliary dilatation. Patient is now referred to surgery for evaluation for cholecystectomy.  Procedure Details:  The patient was seen in the pre-op holding area. The risks, benefits, complications, treatment options, and expected outcomes were previously discussed with the patient. The patient agreed with the proposed plan and has signed the informed consent form.  The patient was brought to the Operating Room, identified as Delsa Walder and the procedure verified as laparoscopic cholecystectomy with intraoperative cholangiography. A "time out" was completed and the above information confirmed.  The patient was placed in the supine position. Following induction of general anesthesia, the abdomen was prepped and draped in the usual aseptic fashion.  An incision was made in the skin near the umbilicus. The midline fascia was incised and the peritoneal cavity was  entered and a Hasson canula was introduced under direct vision.  The Hasson canula was secured with a 0-Vicryl pursestring suture. Pneumoperitoneum was established with carbon dioxide. Additional trocars were introduced under direct vision along the right costal margin in the midline, mid-clavicular line, and anterior axillary line.   The gallbladder was identified and the fundus grasped and retracted cephalad. Adhesions were taken down bluntly and the electrocautery was utilized as needed, taking care not to injure any adjacent structures. The infundibulum was grasped and retracted laterally, exposing the peritoneum overlying the triangle of Calot. The peritoneum was incised and structures exposed with blunt dissection. The cystic duct was clearly identified, bluntly dissected circumferentially, and clipped at the neck of the gallbladder.  An incision was made in the cystic duct and the cholangiogram catheter introduced. The catheter was secured using an ligaclip.  Real-time cholangiography was performed using C-arm fluoroscopy.  There was rapid filling of a normal caliber common bile duct.  There was reflux of contrast into the left and right hepatic ductal systems.  There was free flow distally into the duodenum without filling defect or obstruction.  The catheter was removed from the peritoneal cavity.  The cystic duct was then ligated with surgical clips and divided. The cystic artery was identified, dissected circumferentially, ligated with ligaclips, and divided.  The gallbladder was dissected away from the liver bed using the electrocautery for hemostasis. The gallbladder was completely removed from the liver and placed into an endocatch bag. The gallbladder was removed in the endocatch bag through the umbilical port site and submitted to pathology for review.  The right upper quadrant was irrigated and the gallbladder bed was inspected. Hemostasis was achieved with the  electrocautery.  Pneumoperitoneum was released after viewing removal of the trocars with good hemostasis  noted. The umbilical wound was irrigated and the fascia was then closed with the pursestring suture.  Local anesthetic was infiltrated at all port sites. The skin incisions were closed with 4-0 Monocril subcuticular sutures and steri-strips and dressings were applied.  Instrument, sponge, and needle counts were correct at the conclusion of the case.  The patient was awakened from anesthesia and brought to the recovery room in stable condition.  The patient tolerated the procedure well.   Darnell Levelodd Laurie Lovejoy, MD [facility] Office: [phone]

## 2017-03-08 NOTE — Transfer of Care (Signed)
Immediate Anesthesia Transfer of Care Note  Patient: Erin Ashley  Procedure(s) Performed: LAPAROSCOPIC CHOLECYSTECTOMY WITH INTRAOPERATIVE CHOLANGIOGRAM (N/A Abdomen)  Patient Location: PACU  Anesthesia Type:General  Level of Consciousness: awake, alert  and oriented  Airway & Oxygen Therapy: Patient Spontanous Breathing and Patient connected to face mask oxygen  Post-op Assessment: Report given to RN  Post vital signs: Reviewed and stable  Last Vitals:  Vitals:   03/08/17 0538  BP: (!) 126/91  Pulse: 88  Resp: 16  Temp: 36.9 C  SpO2: 100%    Last Pain:  Vitals:   03/08/17 0538  TempSrc: Oral         Complications: No apparent anesthesia complications

## 2017-03-09 ENCOUNTER — Encounter (HOSPITAL_COMMUNITY): Payer: Self-pay | Admitting: Surgery

## 2017-03-09 MED ORDER — TRAMADOL HCL 50 MG PO TABS: 50.0000 mg | ORAL_TABLET | Freq: Four times a day (QID) | ORAL | 0 refills | Status: DC | PRN

## 2017-03-09 NOTE — Discharge Summary (Signed)
Physician Discharge Summary [facility]  Patient ID: Erin Ashley MRN: [id number] DOB/AGE: Sep 02, 1990 26 y.o.  Admit date: 03/08/2017 Discharge date: 03/09/2017  Admission Diagnoses:  Chronic cholecystitis, gallbladder sludge, abdominal pain  Discharge Diagnoses:  Principal Problem:   Chronic cholecystitis Active Problems:   Gallbladder sludge   Discharged Condition: good  Hospital Course: Patient was admitted for observation following gallbladder surgery.  Post op course was uncomplicated.  Pain was well controlled.  Tolerated diet.  Patient was prepared for discharge home on POD#1.  Consults: None  Treatments: surgery: lap chole with IOC  Discharge Exam: Blood pressure 113/83, pulse (!) 54, temperature 97.8 F (36.6 C), temperature source Oral, resp. rate 17, height 5\' 5"  (1.651 m), weight 75.8 kg (167 lb), last menstrual period 02/19/2017, SpO2 99 %. HEENT - clear Neck - soft Chest - clear bilaterally Cor - RRR Abd - soft without distension; dressings dry and intact; mild tenderness RUQ  Disposition: Home  Discharge Instructions    Diet - low sodium heart healthy   Complete by:  As directed    Discharge instructions   Complete by:  As directed    [facility]  LAPAROSCOPIC SURGERY:  POST-OP INSTRUCTIONS  Always review your discharge instruction sheet given to you by the facility where your surgery was performed.  A prescription for pain medication may be given to you upon discharge.  Take your pain medication as prescribed.  If narcotic pain medicine is not needed, then you may take acetaminophen (Tylenol) or ibuprofen (Advil) as needed.  Take your usually prescribed medications unless otherwise directed.  If you need a refill on your pain medication, please contact your pharmacy.  They will contact our office to request authorization. Prescriptions will not be filled after 5 P.M. or on weekends.  You should follow a light  diet the first few days after arrival home, such as soup and crackers or toast.  Be sure to include plenty of fluids daily.  Most patients will experience some swelling and bruising in the area of the incisions.  Ice packs will help.  Swelling and bruising can take several days to resolve.   It is common to experience some constipation after surgery.  Increasing fluid intake and taking a stool softener (such as Colace) will usually help or prevent this problem from occurring.  A mild laxative (Milk of Magnesia or Miralax) should be taken according to package instructions if there has been no bowel movement after 48 hours.  You will have steri-strips and a gauze dressing over your incisions.  You may remove the gauze bandage on the second day after surgery, and you may shower at that time.  Leave your steri-strips (small skin tapes) in place directly over the incision.  These strips should remain on the skin for 5-7 days and then be removed.  You may get them wet in the shower and pat them dry.  Any sutures or staples will be removed at the office during your follow-up visit.  ACTIVITIES:  You may resume regular (light) daily activities beginning the next day - such as daily self-care, walking, climbing stairs - gradually increasing activities as tolerated.  You may have sexual intercourse when it is comfortable.  Refrain from any heavy lifting or straining until approved by your doctor.  You may drive when you are no longer taking prescription pain medication, you can comfortably wear a seatbelt, and you can safely maneuver your car and apply brakes.  You should  see your doctor in the office for a follow-up appointment approximately 2-3 weeks after your surgery.  Make sure that you call for this appointment within a day or two after you arrive home to insure a convenient appointment time.  WHEN TO CALL YOUR DOCTOR: Fever over 101.0 Inability to urinate Continued bleeding from incision Increased  pain, redness, or drainage from the incision Increasing abdominal pain  The clinic staff is available to answer your questions during regular business hours.  Please don't hesitate to call and ask to speak to one of the nurses for clinical concerns.  If you have a medical emergency, go to the nearest emergency room or call 911.  A surgeon from [facility] is always on call for the hospital.  Velora Heckler, MD, [facility] Office: [phone] Toll Free:  [phone] FAX [phone]  Website: [email or web address]   Increase activity slowly   Complete by:  As directed    Remove dressing in 24 hours   Complete by:  As directed      Allergies as of 03/09/2017      Reactions   Amoxicillin Anaphylaxis, Swelling   Has patient had a PCN reaction causing immediate rash, facial/tongue/throat swelling, SOB or lightheadedness with hypotension: Yes Has patient had a PCN reaction causing severe rash involving mucus membranes or skin necrosis: No Has patient had a PCN reaction that required hospitalization: No Has patient had a PCN reaction occurring within the last 10 years: Yes Throat swelling If all of the above answers are "NO", then may proceed with Cephalosporin use.   Penicillins Rash   REACTION: rash around mouth Has patient had a PCN reaction causing immediate rash, facial/tongue/throat swelling, SOB or lightheadedness with hypotension: No Has patient had a PCN reaction causing severe rash involving mucus membranes or skin necrosis: No Has patient had a PCN reaction that required hospitalization: No Has patient had a PCN reaction occurring within the last 10 years: No If all of the above answers are "NO", then may proceed with Cephalosporin use.      Medication List    TAKE these medications   acetaminophen 500 MG tablet Commonly known as:  TYLENOL Take 1,000 mg by mouth every 6 (six) hours as needed (for pain.).   cetirizine 10  MG tablet Commonly known as:  ZYRTEC Take 10 mg by mouth daily.   escitalopram 10 MG tablet Commonly known as:  LEXAPRO Take 1 tablet (10 mg total) by mouth daily.   fluticasone 50 MCG/ACT nasal spray Commonly known as:  FLONASE One spray in each nostril twice a day, use left hand for right nostril, and right hand for left nostril. What changed:    how much to take  how to take this  when to take this  additional instructions   ipratropium 0.06 % nasal spray Commonly known as:  ATROVENT Place 1 spray into both nostrils 4 (four) times daily as needed. What changed:  when to take this   omeprazole 40 MG capsule Commonly known as:  PRILOSEC Take 1 capsule (40 mg total) by mouth daily. What changed:    when to take this  reasons to take this   ondansetron 8 MG tablet Commonly known as:  ZOFRAN Take 1 tablet (8 mg total) by mouth every 8 (eight) hours as needed for nausea or vomiting.   ranitidine 150 MG capsule Commonly known as:  ZANTAC Take 1 capsule (150 mg total) by mouth every evening. What changed:  when to take this  reasons to take this   traMADol 50 MG tablet Commonly known as:  ULTRAM Take 1-2 tablets (50-100 mg total) by mouth every 6 (six) hours as needed for moderate pain (mild pain unresponsive to Tylenol or Advil).   TRI-PREVIFEM 0.18/0.215/0.25 MG-35 MCG tablet Generic drug:  Norgestimate-Ethinyl Estradiol Triphasic TAKE 1 TABLET BY MOUTH EVERY DAY      Follow-up Information    Darnell Level, MD. Schedule an appointment as soon as possible for a visit in 3 week(s).   Specialty:  General Surgery Contact information: [address] [place] [address] [phone]           Velora Heckler, MD, [facility] Office: [phone]   Signed: Velora Heckler 03/09/2017, 8:24 AM

## 2018-02-23 IMAGING — DX DG CHEST 2V
2 series · 2 of 2 positions shown · non-contrast
Comparison: None.

CLINICAL DATA: Shortness of breath for 1 day, recent difficulty
swallowing

EXAM:
CHEST  2 VIEW

[chest pa]
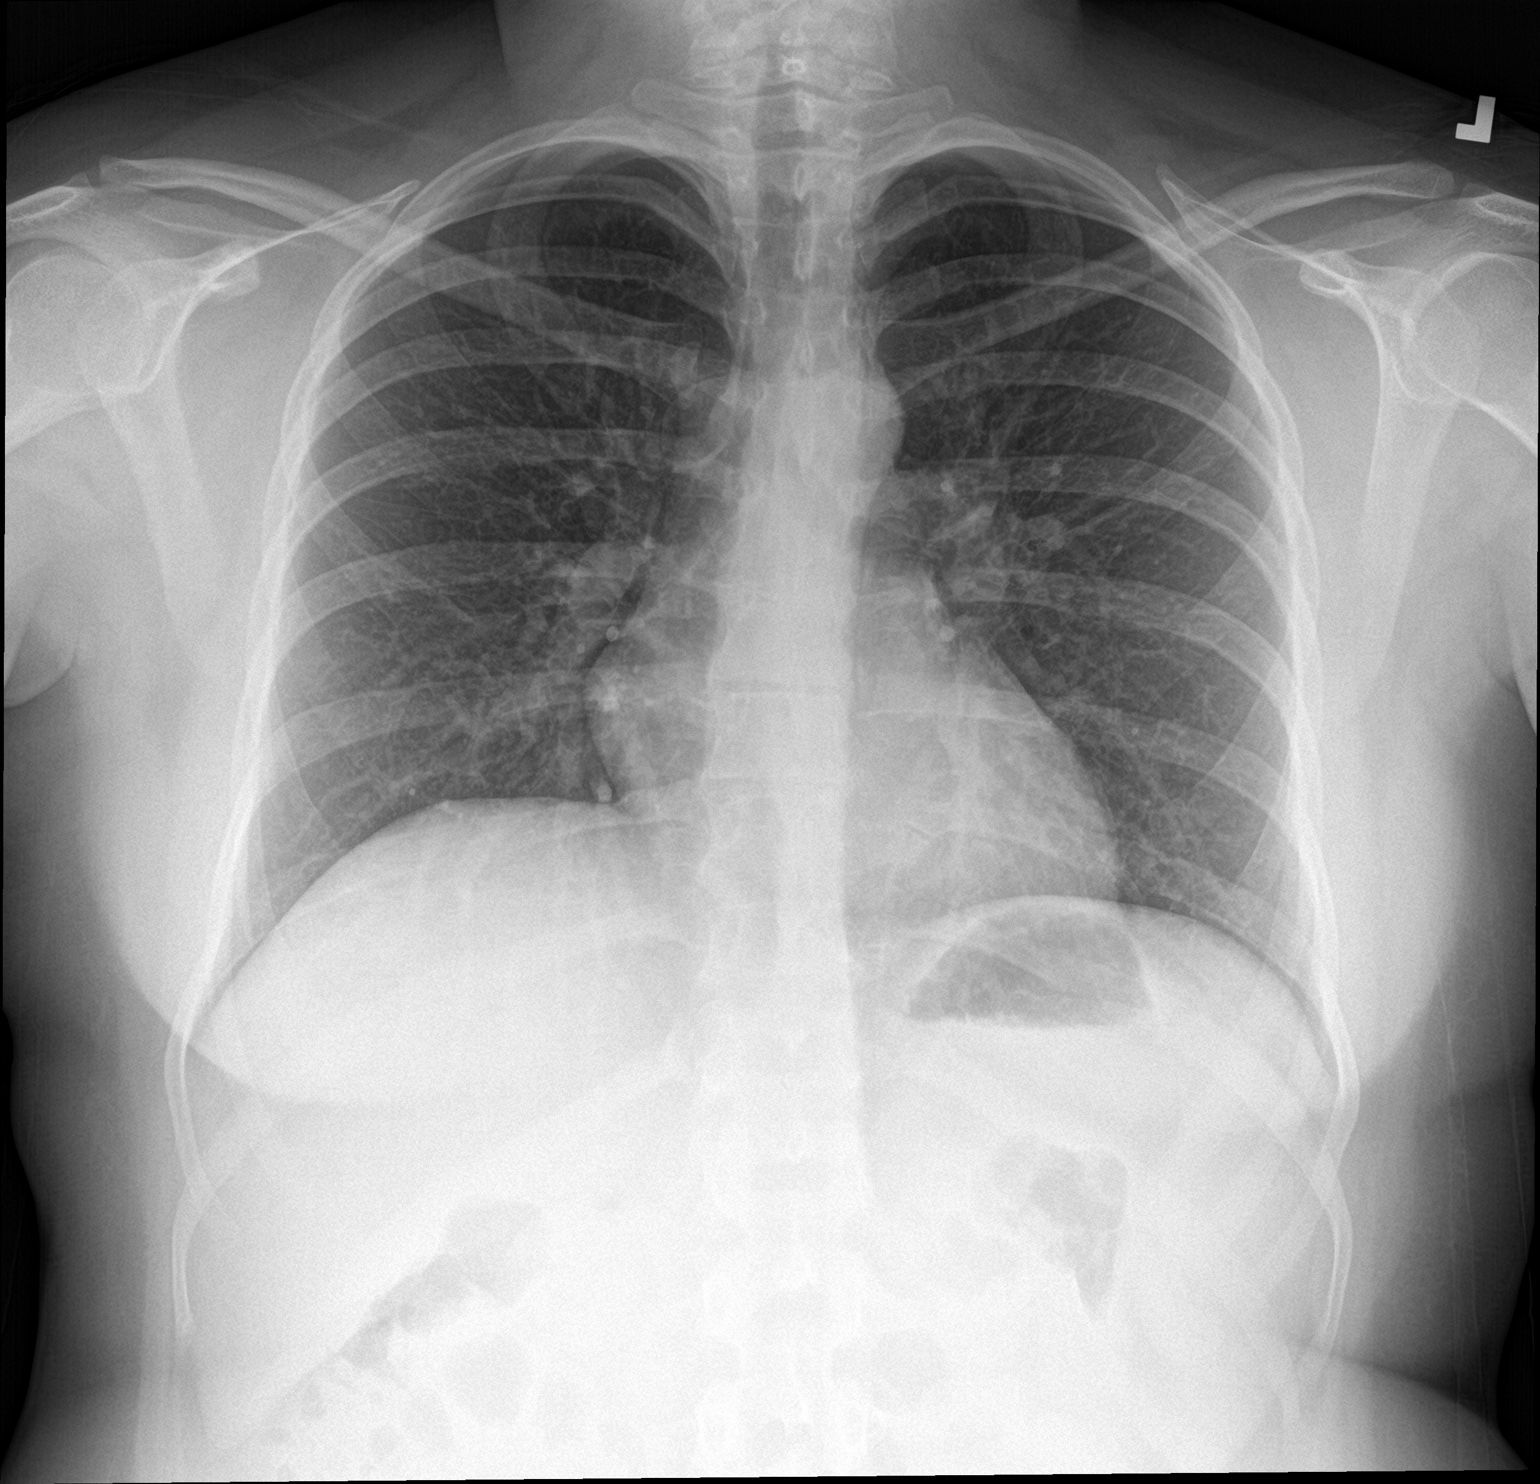

[chest lat]
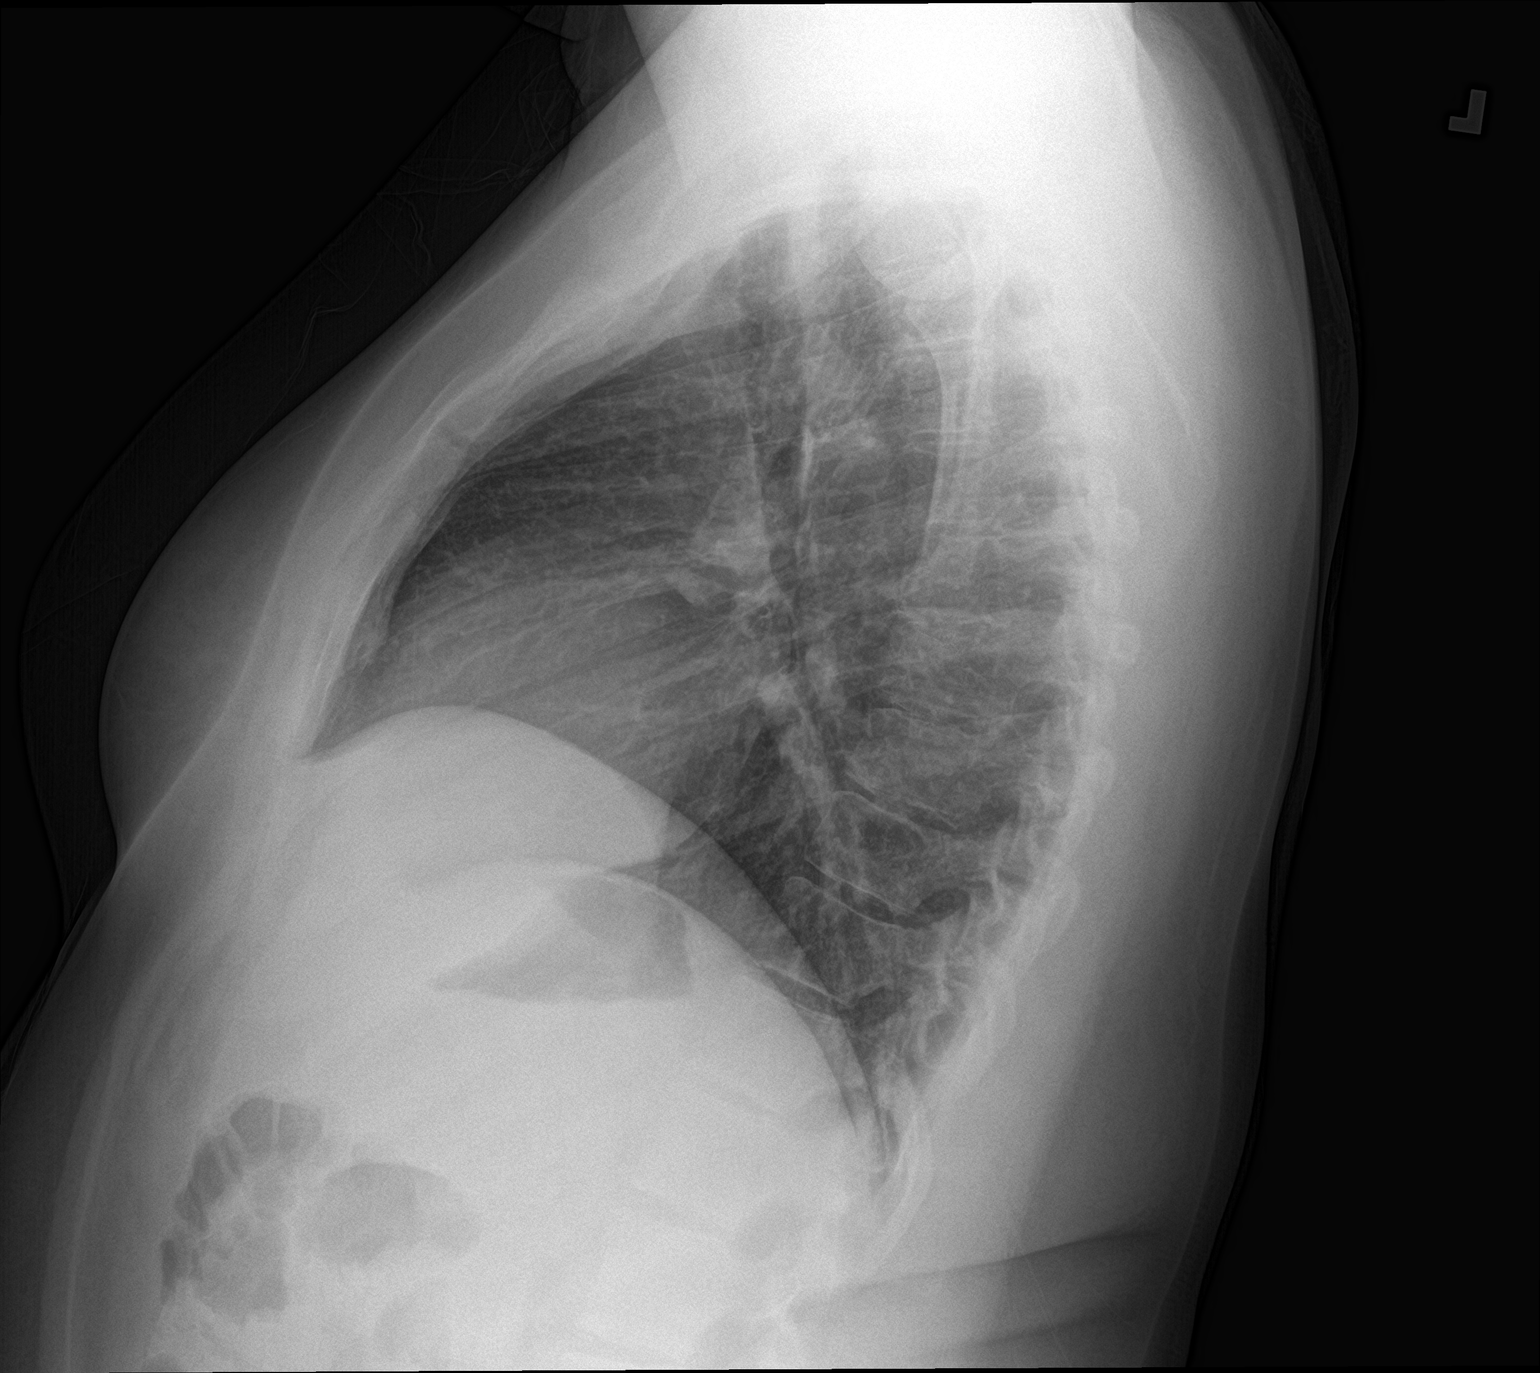

[2 of 2 positions shown; findings below may reference images not displayed]

FINDINGS: The heart size and mediastinal contours are within normal limits.
Both lungs are clear. The visualized skeletal structures are
unremarkable.
IMPRESSION: No active cardiopulmonary disease.

## 2018-02-26 IMAGING — US US ABDOMEN COMPLETE
1 series · 14 of 25 positions shown · non-contrast
Comparison: None.

CLINICAL DATA: Right upper quadrant pain with nausea and vomiting
for 2-3 months.

EXAM:
ABDOMEN ULTRASOUND COMPLETE

[Series 1: us abdomen complete · 0.11mm/px · 14 of 83 slices shown]
[im 1/83]
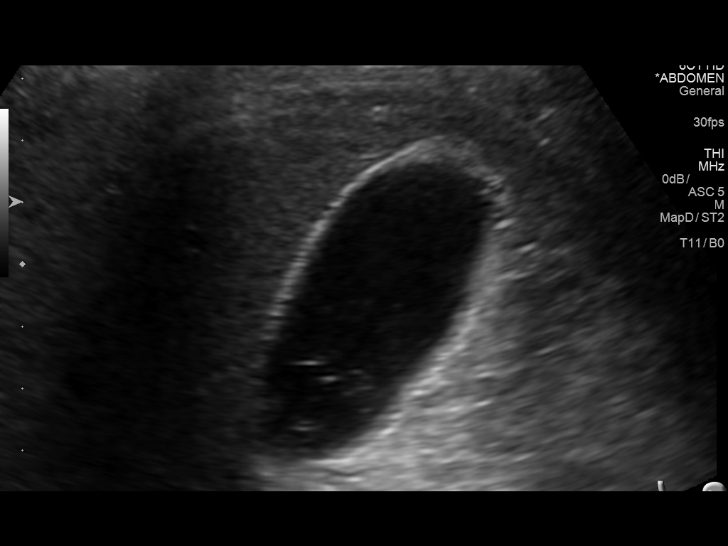
[im 7/83]
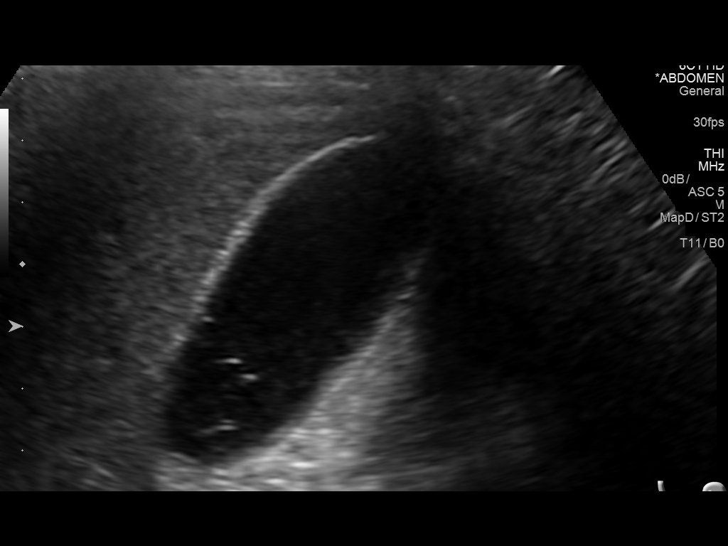
[im 14/83]
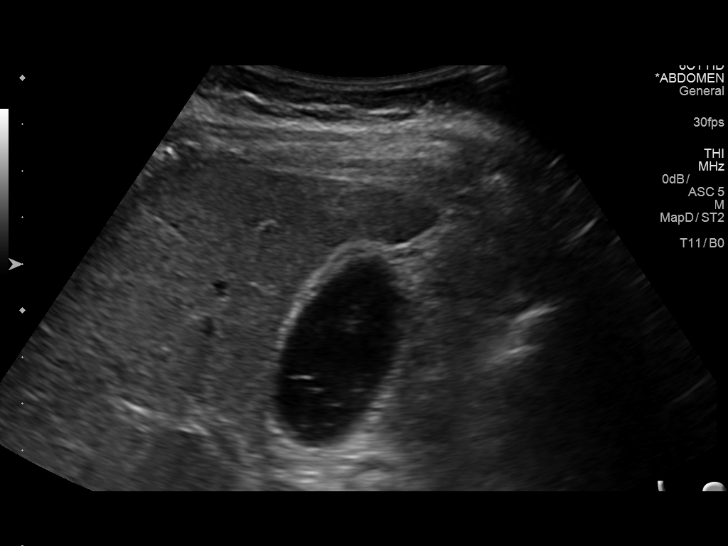
[im 21/83]
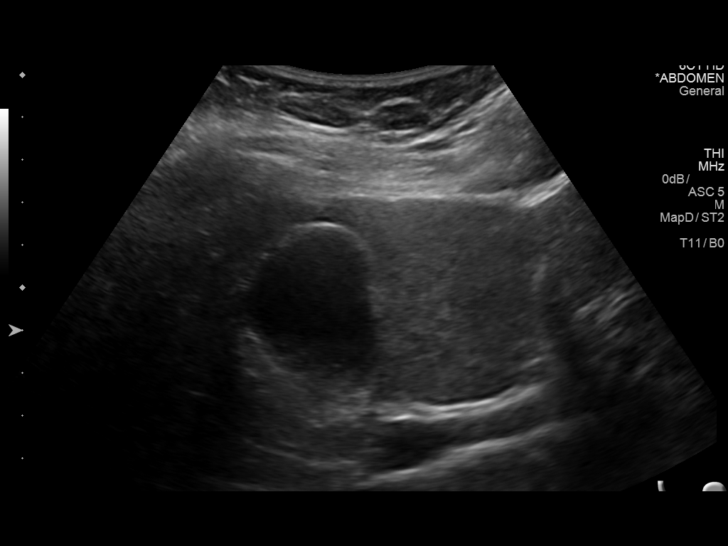
[im 28/83]
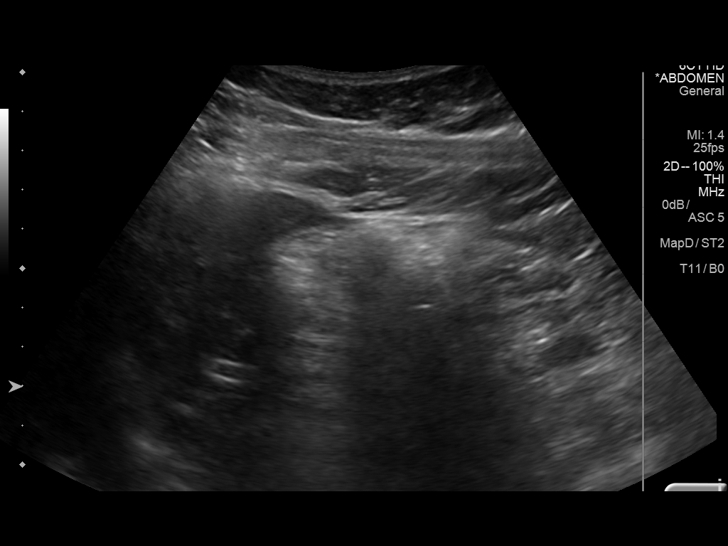
[im 31/83]
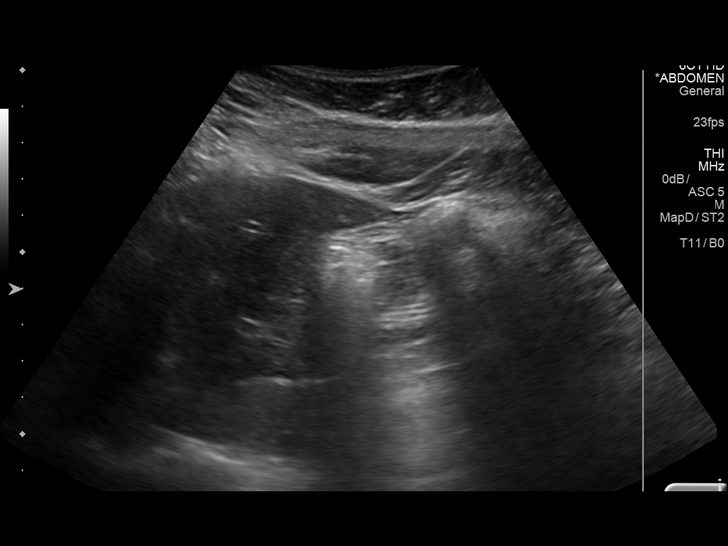
[im 38/83]
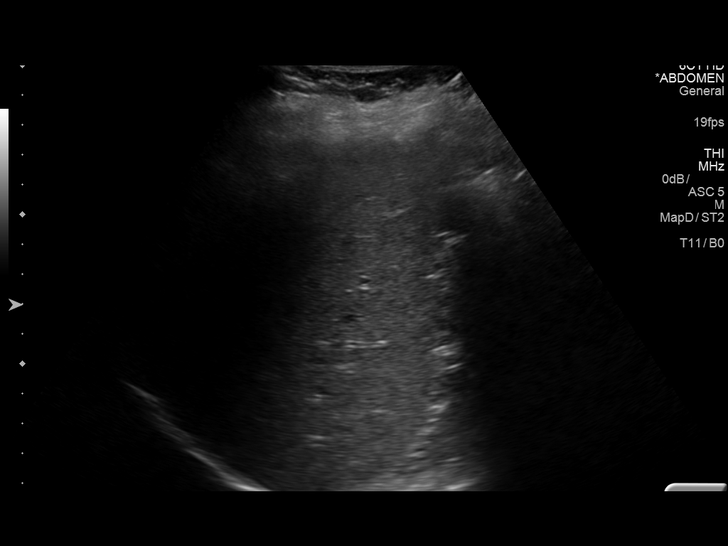
[im 45/83]
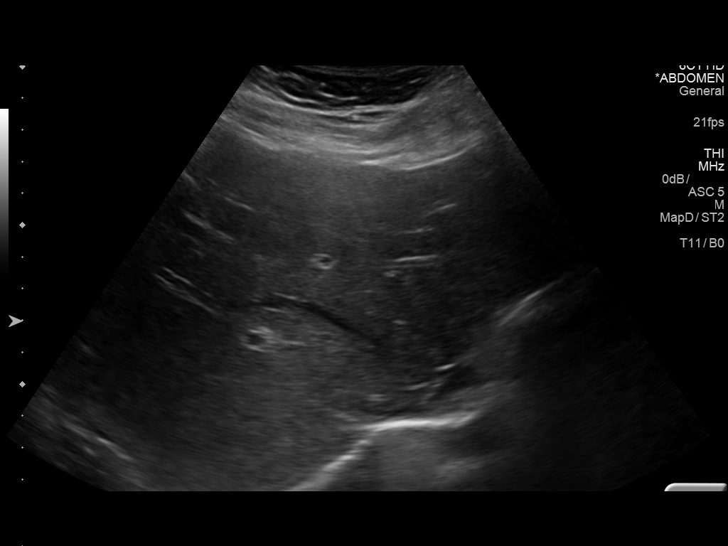
[im 52/83]
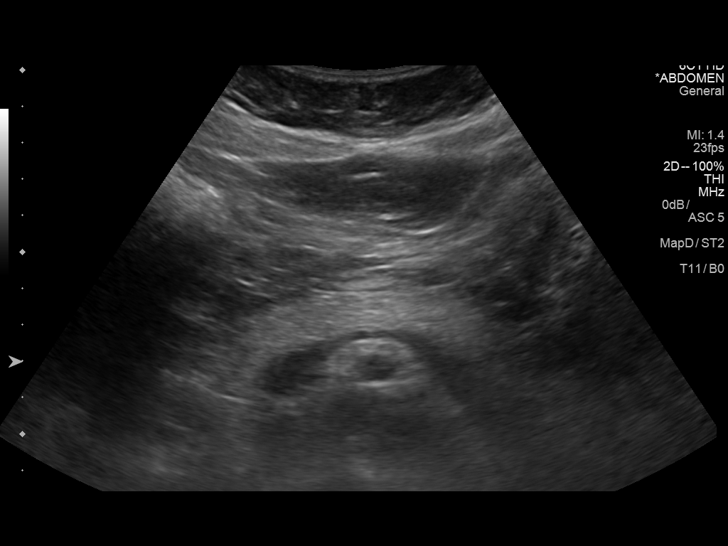
[im 55/83]
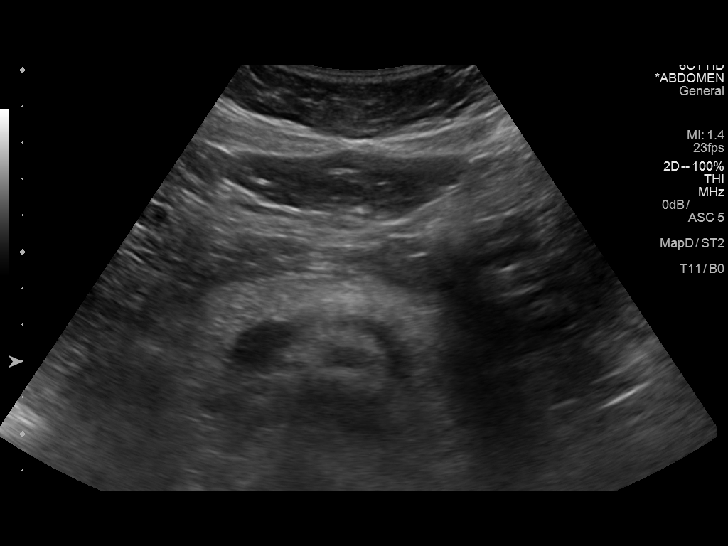
[im 62/83]
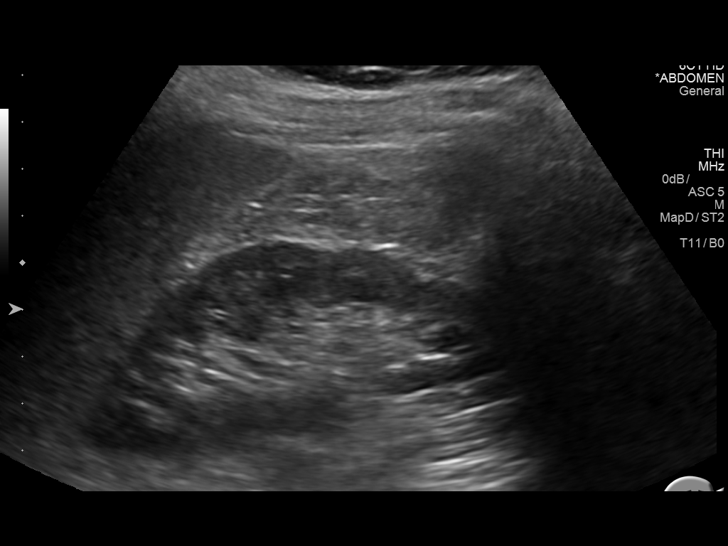
[im 69/83]
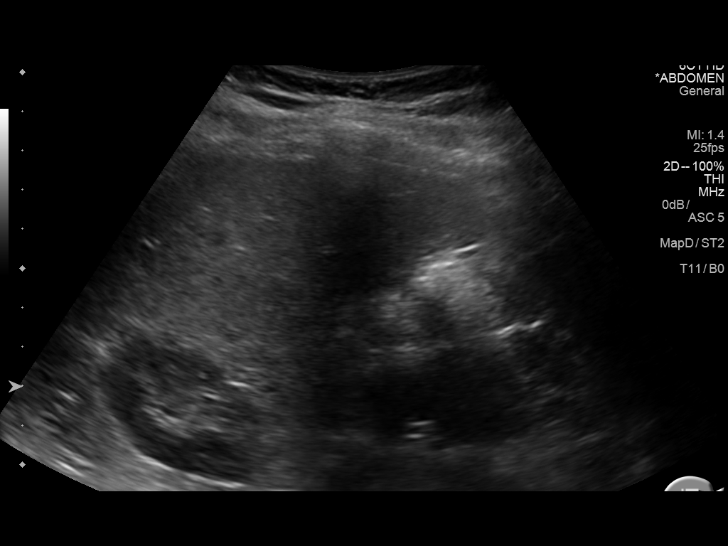
[im 76/83]
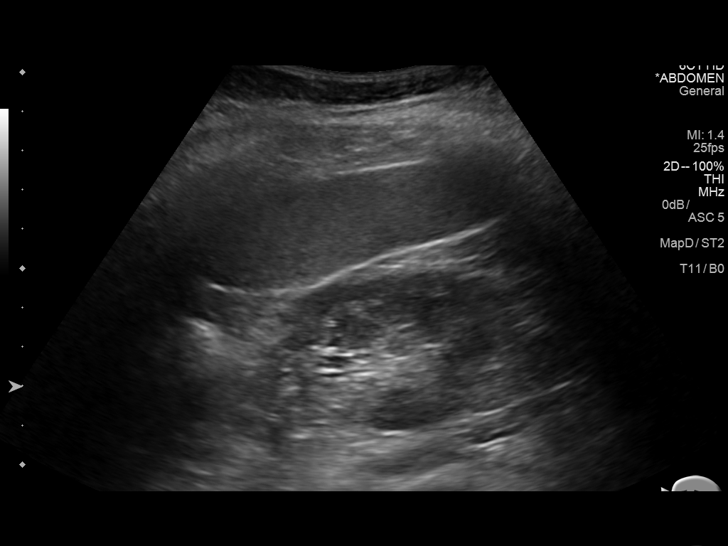
[im 83/83]
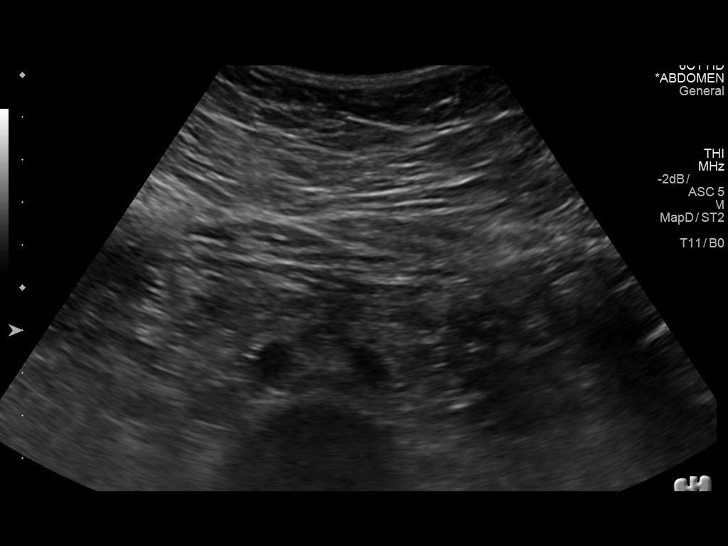

[14 of 25 positions shown; findings below may reference images not displayed]

FINDINGS: Gallbladder: Gallbladder sludge noted. No stones. No gallbladder
wall thickening. Negative sonographic Murphy's sign.

Common bile duct: Diameter: 2.2 mm

Liver: No focal lesion identified. Within normal limits in
parenchymal echogenicity. Portal vein is patent on color Doppler
imaging with normal direction of blood flow towards the liver.

IVC: No abnormality visualized.

Pancreas: Visualized portion unremarkable.

Spleen: Size and appearance within normal limits.

Right Kidney: Length: 9.5 cm. Echogenicity within normal limits. No
mass or hydronephrosis visualized.

Left Kidney: Length: 10.3 cm. Echogenicity within normal limits. No
mass or hydronephrosis visualized.

Abdominal aorta: No aneurysm visualized.

Other findings: None.
IMPRESSION: 1. No acute findings.
2. Gallbladder sludge.

## 2018-03-19 ENCOUNTER — Other Ambulatory Visit: Payer: Self-pay | Admitting: Physician Assistant

## 2019-01-06 ENCOUNTER — Other Ambulatory Visit: Payer: Self-pay

## 2019-01-06 ENCOUNTER — Encounter: Payer: Self-pay | Admitting: Emergency Medicine

## 2019-01-06 ENCOUNTER — Emergency Department
Admission: EM | Admit: 2019-01-06 | Discharge: 2019-01-06 | Disposition: A | Discharge status: Home / Self Care | Payer: Self-pay | Source: Home / Self Care | Attending: Emergency Medicine | Admitting: Emergency Medicine

## 2019-01-06 DIAGNOSIS — U071 COVID-19: Secondary | ICD-10-CM

## 2019-01-06 DIAGNOSIS — G4452 New daily persistent headache (NDPH): Secondary | ICD-10-CM

## 2019-01-06 LAB — POC SARS CORONAVIRUS 2 AG -  ED: SARS Coronavirus 2 Ag: POSITIVE — AB

## 2019-01-06 MED ORDER — ACETAMINOPHEN 325 MG PO TABS
325.0000 mg | ORAL_TABLET | Freq: Once | ORAL | Status: AC
  Administered 2019-01-06: 325 mg via ORAL

## 2019-01-06 NOTE — ED Triage Notes (Signed)
Pt c/o HA x 1wk;  Fatigue x 2 days; fever and body aches x 1 day.

## 2019-01-06 NOTE — Discharge Instructions (Addendum)
Please notify work of your diagnosis. Continue Tylenol for your headache. Advised vitamin D 5000 units a day. Advise vitamin C 500 mg twice a day. Advise zinc 50 mg 1 a day. If you develop chest pain or worsening shortness of breath please be seen in the emergency room. You have a quarantine of 10 days

## 2019-01-06 NOTE — ED Provider Notes (Addendum)
[facility]    CSN: [id number] Arrival date & time: 01/06/19  0844      History   Chief Complaint Chief Complaint  Patient presents with  . Headache    HPI Erin Ashley is a 28 y.o. female.  Patient enters with a chief complaint of a bilateral frontal headache of 7 days duration.  She has been taking Tylenol for this on Friday she began having fever scratchy throat and mild cough.  She feels achy with chills.  She has felt weak at times if she gets up quickly.  She states she has been trying to drink good quantities of fluid.  She has not to date had any Covid testing.  She had a loose stool today. HPI  Past Medical History:  Diagnosis Date  . Anxiety   . Chronic cholecystitis   . Depression   . GERD (gastroesophageal reflux disease)   . Headache    allergy headaches    Patient Active Problem List   Diagnosis Date Noted  . Chronic cholecystitis 02/28/2017  . Gallbladder sludge 02/02/2017  . No energy 02/02/2017  . Non-intractable vomiting with nausea 02/02/2017  . RUQ pain 02/02/2017  . Dry heaves 11/29/2016  . Nausea 11/29/2016  . Elevated ALT measurement 11/24/2016  . Acute bilateral upper abdominal pain 11/23/2016  . Constipation 10/11/2015  . Abdominal pain, left lower quadrant 10/11/2015  . Axillary mass 10/11/2015  . Mouth ulcers 06/30/2015  . Gastroesophageal reflux disease with esophagitis 06/30/2015  . Hearing impairment 05/04/2015  . 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 Pollen allergies 06/22/2014  . Anxiety state 07/01/2013  . Depression 07/01/2013    Past Surgical History:  Procedure Laterality Date  . CHOLECYSTECTOMY N/A 03/08/2017   Procedure: LAPAROSCOPIC CHOLECYSTECTOMY WITH INTRAOPERATIVE CHOLANGIOGRAM;  Surgeon: Darnell Ashley, Todd, MD;  Location: [facility];  Service: General;  Laterality: N/A;  . TONSILLECTOMY  as child   and adenoids  . WISDOM TOOTH EXTRACTION      OB History   No obstetric history on file.      Home  Medications    Prior to Admission medications   Medication Sig Start Date End Date Taking? Authorizing Provider  acetaminophen (TYLENOL) 500 MG tablet Take 1,000 mg by mouth every 6 (six) hours as needed (for pain.).   Yes [provider]  cetirizine (ZYRTEC) 10 MG tablet Take 10 mg by mouth daily.   Yes [provider]  escitalopram (LEXAPRO) 10 MG tablet Take 1 tablet (10 mg total) by mouth daily. 02/28/17  Yes Ashley, Erin L, PA-C  fluticasone (FLONASE) 50 MCG/ACT nasal spray One spray in each nostril twice a day, use left hand for right nostril, and right hand for left nostril. Patient taking differently: Place 1 spray into both nostrils daily. One spray in each nostril twice a day, use left hand for right nostril, and right hand for left nostril. 06/12/14  Yes Monica Bectonhekkekandam, Thomas J, MD  ipratropium (ATROVENT) 0.06 % nasal spray Place 1 spray into both nostrils 4 (four) times daily as needed. Patient taking differently: Place 1 spray into both nostrils as needed.  11/20/16  Yes Carlis Stableummings, Charley Elizabeth, PA-C  Norgestimate-Ethinyl Estradiol Triphasic (TRI-PREVIFEM) 0.18/0.215/0.25 MG-35 MCG tablet Take 1 tablet by mouth daily. Appointment MUST be made for refills. 03/19/18  Yes Ashley, Erin L, PA-C  omeprazole (PRILOSEC) 40 MG capsule Take 1 capsule (40 mg total) by mouth daily. Patient taking differently: Take 40 mg by mouth daily as needed (for heartburn/indigestion.).  11/29/16  Yes Ashley, Erin L, PA-C  ondansetron (ZOFRAN) 8 MG tablet Take 1 tablet (8 mg total) by mouth every 8 (eight) hours as needed for nausea or vomiting. 11/29/16  Yes Ashley, Erin L, PA-C  ranitidine (ZANTAC) 150 MG capsule Take 1 capsule (150 mg total) by mouth every evening. Patient taking differently: Take 150 mg by mouth at bedtime as needed (for indigestion/heartburn.).  11/29/16  Yes Ashley, Royetta Car, PA-C    Family History Family History  Problem Relation Age of Onset  .  Hyperlipidemia Father   . Hypertension Father   . Diabetes Father   . Hypertension Paternal Grandmother   . Hyperlipidemia Paternal Grandmother   . Hyperlipidemia Paternal Grandfather   . Hypertension Paternal Grandfather     Social History Social History   Tobacco Use  . Smoking status: Never Smoker  . Smokeless tobacco: Never Used  Substance Use Topics  . Alcohol use: Yes    Comment: occ  . Drug use: No     Allergies   Amoxicillin and Penicillins   Review of Systems Review of Systems  Constitutional: Positive for chills and fever.  HENT: Positive for postnasal drip. Negative for ear pain and sore throat.   Eyes: Negative.   Respiratory: Positive for cough. Negative for shortness of breath and wheezing.   Cardiovascular: Negative.   Gastrointestinal: Positive for diarrhea.  Musculoskeletal: Negative.   Neurological: Positive for dizziness and headaches.       She did feel lightheaded last night when she got up quickly.     Physical Exam Triage Vital Signs ED Triage Vitals  Enc Vitals Group     BP 01/06/19 0900 (!) 130/92     Pulse Rate 01/06/19 0900 (!) 128     Resp 01/06/19 0900 16     Temp 01/06/19 0900 100.2 F (37.9 C)     Temp Source 01/06/19 0900 Oral     SpO2 01/06/19 0900 98 %     Weight 01/06/19 0858 182 lb (82.6 kg)     Height 01/06/19 0858 5\' 5"  (1.651 m)     Head Circumference --      Peak Flow --      Pain Score 01/06/19 0857 0     Pain Loc --      Pain Edu? --      Excl. in [place]? --    No data found.  Updated Vital Signs BP (!) 130/92 (BP Location: Left Arm)   Pulse (!) 128   Temp 100.2 F (37.9 C) (Oral)   Resp 16   Ht 5\' 5"  (1.651 m)   Wt 82.6 kg   LMP 12/20/2018   SpO2 98%   BMI 30.29 kg/m   Visual Acuity Right Eye Distance:   Left Eye Distance:   Bilateral Distance:    Right Eye Near:   Left Eye Near:    Bilateral Near:     Physical Exam Constitutional:      Appearance: She is not ill-appearing or toxic-appearing.   HENT:     Mouth/Throat:     Comments: There is mild redness posterior pharynx Eyes:     Pupils: Pupils are equal, round, and reactive to light.  Neck:     Comments: There is mild tenderness anterior cervical Cardiovascular:     Rate and Rhythm: Tachycardia present.     Heart sounds: No murmur.  Pulmonary:     Effort: Pulmonary effort is normal.     Breath sounds: Normal breath sounds. No  wheezing.  Abdominal:     Palpations: Abdomen is soft.  Neurological:     Mental Status: She is alert and oriented to person, place, and time.     Cranial Nerves: No cranial nerve deficit.     Motor: No weakness.  Psychiatric:        Mood and Affect: Mood normal.        Behavior: Behavior normal.      UC Treatments / Results  Labs (all labs ordered are listed, but only abnormal results are displayed) Labs Reviewed  POC SARS CORONAVIRUS 2 AG -  ED - Abnormal; Notable for the following components:      Result Value   SARS Coronavirus 2 Ag Positive (*)    All other components within normal limits    EKG   Radiology No results found.  Procedures Procedures (including critical care time)  Medications Ordered in UC Medications  acetaminophen (TYLENOL) tablet 325 mg (325 mg Oral Given 01/06/19 0905)    Initial Impression / Assessment and Plan / UC Course  I have reviewed the triage vital signs and the nursing notes. Rapid Covid test is positive.  She will continue Tylenol for the headache.  She will be instructed about vitamin D, vitamin C, and zinc.  She needs to notify her work.  She was given danger signs regarding her Covid diagnosis and to present to the emergency room if she develops chest pain or shortness of breath.  All questions were answered.  She was given an out of work note for 10 days Pertinent labs & imaging results that were available during my care of the patient were reviewed by me and considered in my medical decision making (see chart for details).     Final  Clinical Impressions(s) / UC Diagnoses   Final diagnoses:  COVID-19  New daily persistent headache     Discharge Instructions     Please notify work of your diagnosis. Continue Tylenol for your headache. Advised vitamin D 5000 units a day. Advise vitamin C 500 mg twice a day. Advise zinc 50 mg 1 a day. If you develop chest pain or worsening shortness of breath please be seen in the emergency room.    ED Prescriptions    None     PDMP not reviewed this encounter.   Collene Gobble, MD 01/06/19 7829    Collene Gobble, MD 01/06/19 [phone]

## 2019-05-01 ENCOUNTER — Other Ambulatory Visit: Payer: Self-pay | Admitting: Physician Assistant

## 2019-05-22 ENCOUNTER — Other Ambulatory Visit: Payer: Self-pay | Admitting: Physician Assistant

## 2019-05-23 ENCOUNTER — Ambulatory Visit (INDEPENDENT_AMBULATORY_CARE_PROVIDER_SITE_OTHER): Payer: Self-pay | Admitting: Nurse Practitioner

## 2019-05-23 ENCOUNTER — Other Ambulatory Visit: Payer: Self-pay

## 2019-05-23 ENCOUNTER — Encounter: Payer: Self-pay | Admitting: Nurse Practitioner

## 2019-05-23 VITALS — BP 104/73 | HR 88 | Temp 98.5°F | Ht 64.25 in | Wt 186.0 lb

## 2019-05-23 DIAGNOSIS — Z124 Encounter for screening for malignant neoplasm of cervix: Secondary | ICD-10-CM

## 2019-05-23 DIAGNOSIS — Z3041 Encounter for surveillance of contraceptive pills: Secondary | ICD-10-CM

## 2019-05-23 DIAGNOSIS — F3342 Major depressive disorder, recurrent, in full remission: Secondary | ICD-10-CM

## 2019-05-23 DIAGNOSIS — F341 Dysthymic disorder: Secondary | ICD-10-CM

## 2019-05-23 DIAGNOSIS — Z0000 Encounter for general adult medical examination without abnormal findings: Secondary | ICD-10-CM

## 2019-05-23 DIAGNOSIS — Z130 Encounter for screening for diseases of the blood and blood-forming organs and certain disorders involving the immune mechanism: Secondary | ICD-10-CM

## 2019-05-23 DIAGNOSIS — N644 Mastodynia: Secondary | ICD-10-CM

## 2019-05-23 MED ORDER — NORGESTIM-ETH ESTRAD TRIPHASIC 0.18/0.215/0.25 MG-35 MCG PO TABS: 1.0000 | ORAL_TABLET | Freq: Every day | ORAL | 12 refills | Status: DC

## 2019-05-23 MED ORDER — ESCITALOPRAM OXALATE 10 MG PO TABS: 10.0000 mg | ORAL_TABLET | Freq: Every day | ORAL | 3 refills | Status: DC

## 2019-05-23 MED ORDER — NORGESTIM-ETH ESTRAD TRIPHASIC 0.18/0.215/0.25 MG-35 MCG PO TABS: 1.0000 | ORAL_TABLET | Freq: Every day | ORAL | 0 refills | Status: DC

## 2019-05-23 NOTE — Patient Instructions (Signed)
Preventive Care 21-28 Years Old, Female Preventive care refers to visits with your health care provider and lifestyle choices that can promote health and wellness. This includes:  A yearly physical exam. This may also be called an annual well check.  Regular dental visits and eye exams.  Immunizations.  Screening for certain conditions.  Healthy lifestyle choices, such as eating a healthy diet, getting regular exercise, not using drugs or products that contain nicotine and tobacco, and limiting alcohol use. What can I expect for my preventive care visit? Physical exam Your health care provider will check your:  Height and weight. This may be used to calculate body mass index (BMI), which tells if you are at a healthy weight.  Heart rate and blood pressure.  Skin for abnormal spots. Counseling Your health care provider may ask you questions about your:  Alcohol, tobacco, and drug use.  Emotional well-being.  Home and relationship well-being.  Sexual activity.  Eating habits.  Work and work environment.  Method of birth control.  Menstrual cycle.  Pregnancy history. What immunizations do I need?  Influenza (flu) vaccine  This is recommended every year. Tetanus, diphtheria, and pertussis (Tdap) vaccine  You may need a Td booster every 10 years. Varicella (chickenpox) vaccine  You may need this if you have not been vaccinated. Human papillomavirus (HPV) vaccine  If recommended by your health care provider, you may need three doses over 6 months. Measles, mumps, and rubella (MMR) vaccine  You may need at least one dose of MMR. You may also need a second dose. Meningococcal conjugate (MenACWY) vaccine  One dose is recommended if you are age 19-21 years and a first-year college student living in a residence hall, or if you have one of several medical conditions. You may also need additional booster doses. Pneumococcal conjugate (PCV13) vaccine  You may need  this if you have certain conditions and were not previously vaccinated. Pneumococcal polysaccharide (PPSV23) vaccine  You may need one or two doses if you smoke cigarettes or if you have certain conditions. Hepatitis A vaccine  You may need this if you have certain conditions or if you travel or work in places where you may be exposed to hepatitis A. Hepatitis B vaccine  You may need this if you have certain conditions or if you travel or work in places where you may be exposed to hepatitis B. Haemophilus influenzae type b (Hib) vaccine  You may need this if you have certain conditions. You may receive vaccines as individual doses or as more than one vaccine together in one shot (combination vaccines). Talk with your health care provider about the risks and benefits of combination vaccines. What tests do I need?  Blood tests  Lipid and cholesterol levels. These may be checked every 5 years starting at age 20.  Hepatitis C test.  Hepatitis B test. Screening  Diabetes screening. This is done by checking your blood sugar (glucose) after you have not eaten for a while (fasting).  Sexually transmitted disease (STD) testing.  BRCA-related cancer screening. This may be done if you have a family history of breast, ovarian, tubal, or peritoneal cancers.  Pelvic exam and Pap test. This may be done every 3 years starting at age 21. Starting at age 30, this may be done every 5 years if you have a Pap test in combination with an HPV test. Talk with your health care provider about your test results, treatment options, and if necessary, the need for more tests.   Follow these instructions at home: Eating and drinking   Eat a diet that includes fresh fruits and vegetables, whole grains, lean protein, and low-fat dairy.  Take vitamin and mineral supplements as recommended by your health care provider.  Do not drink alcohol if: ? Your health care provider tells you not to drink. ? You are  pregnant, may be pregnant, or are planning to become pregnant.  If you drink alcohol: ? Limit how much you have to 0-1 drink a day. ? Be aware of how much alcohol is in your drink. In the [place] one drink equals one 12 oz bottle of beer (355 mL), one 5 oz glass of wine (148 mL), or one 1 oz glass of hard liquor (44 mL). Lifestyle  Take daily care of your teeth and gums.  Stay active. Exercise for at least 30 minutes on 5 or more days each week.  Do not use any products that contain nicotine or tobacco, such as cigarettes, e-cigarettes, and chewing tobacco. If you need help quitting, ask your health care provider.  If you are sexually active, practice safe sex. Use a condom or other form of birth control (contraception) in order to prevent pregnancy and STIs (sexually transmitted infections). If you plan to become pregnant, see your health care provider for a preconception visit. What's next?  Visit your health care provider once a year for a well check visit.  Ask your health care provider how often you should have your eyes and teeth checked.  Stay up to date on all vaccines. This information is not intended to replace advice given to you by your health care provider. Make sure you discuss any questions you have with your health care provider. Document Revised: 09/27/2017 Document Reviewed: 09/27/2017 Elsevier Patient Education  2020 [employer].

## 2019-05-23 NOTE — Progress Notes (Signed)
Established Patient Office Visit  Subjective:  Patient ID: Erin Ashley, female    DOB: [birth year]  Age: 28 y.o. MRN: [id number]  CC:  Chief Complaint  Patient presents with  . Annual Exam    with Pap    HPI Erin Ashley is a pleasant 29 year old female presenting for annual physical exam.  She reports a regular diet.  She has been increasing her water intake and decreasing her soda intake. She denies any changes to her weight. She reports regular vision exams.  She does wear glasses which help her vision. She currently does not have insurance but she works for a [employer] and is able to have teeth cleanings performed.  Her most recent was in March. She is currently up-to-date on her vaccinations including completion of HPV. She is currently sexually active with a monogamous relationship.  She is on birth control pills and they utilize condoms for protection.  She is not trying to get pregnant at this time. She reports regular menstrual cycles.  Her last menstrual period was about 2 weeks ago.  She does report some soreness in her right breast today that has been going on since right before her last menstrual cycle.  She reports that this is a common occurrence in her breasts bilaterally right before and during her menstrual cycle but this usually goes away.  The tenderness in her right breast is located on the medial portion of the areola.  She denies drainage or skin changes. She reports her mood is overall well controlled on 10 mg Lexapro daily.  She does not wish for any changes to that at this time. She denies any changes to her bowel or bladder habits and reports regular stools. She denies any changes to her skin or irregular moles.   She does report seasonal allergies which are well controlled with over-the-counter allergy medication daily.  Past Medical History:  Diagnosis Date  . Anxiety   . Chronic cholecystitis   . Depression   . GERD (gastroesophageal reflux  disease)   . Headache    allergy headaches    Past Surgical History:  Procedure Laterality Date  . CHOLECYSTECTOMY N/A 03/08/2017   Procedure: LAPAROSCOPIC CHOLECYSTECTOMY WITH INTRAOPERATIVE CHOLANGIOGRAM;  Surgeon: Darnell Level, MD;  Location: [facility];  Service: General;  Laterality: N/A;  . TONSILLECTOMY  as child   and adenoids  . WISDOM TOOTH EXTRACTION      Family History  Problem Relation Age of Onset  . Hyperlipidemia Father   . Hypertension Father   . Diabetes Father   . Hypertension Paternal Grandmother   . Hyperlipidemia Paternal Grandmother   . Hyperlipidemia Paternal Grandfather   . Hypertension Paternal Grandfather     Social History   Socioeconomic History  . Marital status: Single    Spouse name: Not on file  . Number of children: Not on file  . Years of education: Not on file  . Highest education level: Not on file  Occupational History  . Not on file  Tobacco Use  . Smoking status: Never Smoker  . Smokeless tobacco: Never Used  Substance and Sexual Activity  . Alcohol use: Yes    Comment: Occasionally  . Drug use: No  . Sexual activity: Yes    Partners: Male    Birth control/protection: Pill  Other Topics Concern  . Not on file  Social History Narrative  . Not on file   Social Determinants of Health   Financial Resource Strain:   .  Difficulty of Paying Living Expenses:   Food Insecurity:   . Worried About Programme researcher, broadcasting/film/videounning Out of Food in the Last Year:   . Baristaan Out of Food in the Last Year:   Transportation Needs:   . Freight forwarderLack of Transportation (Medical):   Marland Kitchen. Lack of Transportation (Non-Medical):   Physical Activity:   . Days of Exercise per Week:   . Minutes of Exercise per Session:   Stress:   . Feeling of Stress :   Social Connections:   . Frequency of Communication with Friends and Family:   . Frequency of Social Gatherings with Friends and Family:   . Attends Religious Services:   . Active Member of Clubs or Organizations:   . Attends Tax inspectorClub or  Organization Meetings:   Marland Kitchen. Marital Status:   Intimate Partner Violence:   . Fear of Current or Ex-Partner:   . Emotionally Abused:   Marland Kitchen. Physically Abused:   . Sexually Abused:     Outpatient Medications Prior to Visit  Medication Sig Dispense Refill  . acetaminophen (TYLENOL) 500 MG tablet Take 1,000 mg by mouth every 6 (six) hours as needed (for pain.).    Marland Kitchen. cetirizine (ZYRTEC) 10 MG tablet Take 10 mg by mouth daily.    Marland Kitchen. escitalopram (LEXAPRO) 10 MG tablet Take 1 tablet (10 mg total) by mouth daily. 90 tablet 3  . Fexofenadine HCl (MUCINEX ALLERGY PO) Take 1 tablet by mouth daily as needed.    . fluticasone (FLONASE) 50 MCG/ACT nasal spray One spray in each nostril twice a day, use left hand for right nostril, and right hand for left nostril. (Patient taking differently: Place 1 spray into both nostrils daily. One spray in each nostril twice a day, use left hand for right nostril, and right hand for left nostril.) 48 g 3  . ipratropium (ATROVENT) 0.06 % nasal spray Place 1 spray into both nostrils 4 (four) times daily as needed. (Patient taking differently: Place 1 spray into both nostrils as needed. ) 15 mL 0  . TRI-PREVIFEM 0.18/0.215/0.25 MG-35 MCG tablet TAKE 1 TABLET BY MOUTH DAILY. APPOINTMENT MUST BE MADE FOR REFILLS. 28 tablet 0  . omeprazole (PRILOSEC) 40 MG capsule Take 1 capsule (40 mg total) by mouth daily. (Patient not taking: Reported on 05/23/2019) 30 capsule 2  . ondansetron (ZOFRAN) 8 MG tablet Take 1 tablet (8 mg total) by mouth every 8 (eight) hours as needed for nausea or vomiting. (Patient not taking: Reported on 05/23/2019) 20 tablet 0  . ranitidine (ZANTAC) 150 MG capsule Take 1 capsule (150 mg total) by mouth every evening. (Patient not taking: Reported on 05/23/2019) 30 capsule 2   No facility-administered medications prior to visit.    Allergies  Allergen Reactions  . Amoxicillin Anaphylaxis and Swelling    Has patient had a PCN reaction causing immediate rash,  facial/tongue/throat swelling, SOB or lightheadedness with hypotension: Yes Has patient had a PCN reaction causing severe rash involving mucus membranes or skin necrosis: No Has patient had a PCN reaction that required hospitalization: No Has patient had a PCN reaction occurring within the last 10 years: Yes Throat swelling If all of the above answers are "NO", then may proceed with Cephalosporin use.   Marland Kitchen. Penicillins Rash    REACTION: rash around mouth Has patient had a PCN reaction causing immediate rash, facial/tongue/throat swelling, SOB or lightheadedness with hypotension: No Has patient had a PCN reaction causing severe rash involving mucus membranes or skin necrosis: No Has patient had a PCN  reaction that required hospitalization: No Has patient had a PCN reaction occurring within the last 10 years: No If all of the above answers are "NO", then may proceed with Cephalosporin use.     ROS Review of Systems  Constitutional: Negative for appetite change, chills, fatigue, fever and unexpected weight change.  HENT: Negative for congestion, dental problem, postnasal drip, rhinorrhea, sinus pressure, sinus pain and sore throat.   Eyes: Negative for visual disturbance.  Respiratory: Negative for cough and shortness of breath.   Cardiovascular: Negative for chest pain, palpitations and leg swelling.  Gastrointestinal: Negative for abdominal distention, abdominal pain, constipation, diarrhea, nausea and vomiting.  Genitourinary: Negative for dysuria, frequency, menstrual problem, pelvic pain and vaginal discharge.  Skin: Negative for color change, pallor, rash and wound.  Allergic/Immunologic: Positive for environmental allergies.  Neurological: Negative for weakness, light-headedness and headaches.  Psychiatric/Behavioral: Negative for dysphoric mood and sleep disturbance. The patient is not nervous/anxious.       Objective:    Physical Exam  Constitutional: She is oriented to  person, place, and time. She appears well-developed and well-nourished.  HENT:  Head: Normocephalic and atraumatic.  Right Ear: External ear normal.  Left Ear: External ear normal.  Nose: Nose normal.  Mouth/Throat: Oropharynx is clear and moist.  Eyes: Pupils are equal, round, and reactive to light. Conjunctivae and EOM are normal.  Neck: No thyromegaly present.  Cardiovascular: Normal rate, regular rhythm, normal heart sounds and intact distal pulses.  Pulmonary/Chest: Effort normal and breath sounds normal. She exhibits no tenderness. Right breast exhibits tenderness. Right breast exhibits no inverted nipple, no mass, no nipple discharge and no skin change. Left breast exhibits no inverted nipple, no mass, no nipple discharge, no skin change and no tenderness. There is breast tenderness. No breast discharge or bleeding. Breasts are symmetrical.    Abdominal: Soft. Bowel sounds are normal.  Genitourinary:    Pelvic exam was performed with patient prone.  There is no rash, tenderness, lesion or injury on the right labia. There is no rash, tenderness, lesion or injury on the left labia. Cervix exhibits no motion tenderness and no friability.    Vaginal bleeding present.     No vaginal discharge.  There is bleeding in the vagina.     Musculoskeletal:        General: No edema. Normal range of motion.     Cervical back: Normal range of motion and neck supple.  Lymphadenopathy:    She has no cervical adenopathy.  Neurological: She is alert and oriented to person, place, and time. She has normal reflexes. No cranial nerve deficit. Coordination normal.  Skin: Skin is warm and dry.  Psychiatric: She has a normal mood and affect. Her behavior is normal. Judgment and thought content normal.  Nursing note and vitals reviewed.   BP 104/73   Pulse 88   Temp 98.5 F (36.9 C) (Oral)   Ht 5' 4.25" (1.632 m)   Wt 186 lb (84.4 kg)   LMP 05/07/2019   SpO2 99%   BMI 31.68 kg/m  Wt Readings  from Last 3 Encounters:  05/23/19 186 lb (84.4 kg)  01/06/19 182 lb (82.6 kg)  03/08/17 167 lb (75.8 kg)     Health Maintenance Due  Topic Date Due  . HIV Screening  Never done  . PAP-Cervical Cytology Screening  10/11/2018  . PAP SMEAR-Modifier  10/11/2018    There are no preventive care reminders to display for this patient.  Lab Results  Component  Value Date   TSH 1.32 01/31/2017   Lab Results  Component Value Date   WBC 5.5 02/27/2017   HGB 13.4 02/27/2017   HCT 40.5 02/27/2017   MCV 85.8 02/27/2017   PLT 230 02/27/2017   Lab Results  Component Value Date   NA 137 11/23/2016   K 4.0 11/23/2016   CO2 22 11/23/2016   GLUCOSE 95 11/23/2016   BUN 8 11/23/2016   CREATININE 0.77 11/23/2016   BILITOT 0.8 01/31/2017   ALKPHOS 62 07/19/2012   AST 12 01/31/2017   ALT 8 01/31/2017   PROT 7.2 01/31/2017   ALBUMIN 4.4 07/19/2012   CALCIUM 9.6 11/23/2016   Lab Results  Component Value Date   CHOL 166 07/19/2012   Lab Results  Component Value Date   HDL 71 07/19/2012   Lab Results  Component Value Date   LDLCALC 84 07/19/2012   Lab Results  Component Value Date   TRIG 57 07/19/2012   Lab Results  Component Value Date   CHOLHDL 2.3 07/19/2012   No results found for: HGBA1C    Assessment & Plan:  1. Encounter for annual physical exam Annual physical exam performed today without any significant findings.  There is a slight amount of tenderness noted on the medial portion of the right areola without any masses or external features present. Will obtain CBC and CMP today to evaluate for iron deficiency anemia or electrolyte imbalances. Pap performed today. Patient to follow-up in 12 months for annual physical exam or sooner if needed. - CBC with Differential - COMPLETE METABOLIC PANEL WITH GFR  2. Screening for cervical cancer Pap with HPV performed today.  No concerning findings on exam.  Small amount of bright red blood was noticed on the cervical os  however the patient does report recent inconsistency with her birth control pills which could be resulting in breakthrough bleeding.  Patient instructed to monitor for bleeding. Will follow up with patient once results are received from testing. - Cytology - PAP  3. Screening for iron deficiency anemia 29 year old female of childbearing age having regular monthly menstrual cycles.  Will monitor CBC to ensure that there is no anemia present. - CBC with Differential  4. Recurrent major depressive disorder, in full remission ([place]) History of recurrent major depressive disorder well-controlled on 10 mg of Lexapro daily and with the use of hormonal supplementation.  No changes to medication today.  Patient instructed to contact the office if she begins to have returning symptoms of anxiety or depression. Follow-up in 1 year. - escitalopram (LEXAPRO) 10 MG tablet; Take 1 tablet (10 mg total) by mouth daily.  Dispense: 90 tablet; Refill: 3 - Norgestimate-Ethinyl Estradiol Triphasic (TRI-PREVIFEM) 0.18/0.215/0.25 MG-35 MCG tablet; Take 1 tablet by mouth daily.  Dispense: 28 tablet; Refill: 0  5. Dysthymia Mood well controlled with 10 mg of Lexapro and hormonal supplementation daily. Number [place] changes today.  Patient instructed to contact the office if she begins to have returning symptoms of anxiety or depression. Follow-up in 1 year. - escitalopram (LEXAPRO) 10 MG tablet; Take 1 tablet (10 mg total) by mouth daily.  Dispense: 90 tablet; Refill: 3 - Norgestimate-Ethinyl Estradiol Triphasic (TRI-PREVIFEM) 0.18/0.215/0.25 MG-35 MCG tablet; Take 1 tablet by mouth daily.  Dispense: 28 tablet; Refill: 0  6. Acute breast pain Acute breast tenderness of the left breast in the areolar region.  The patient does experience cyclical tenderness of her breasts bilaterally.  Exam today was benign.  It is likely that late or  missing doses of hormonal contraceptives have caused hormonal fluctuations resulting in  the persistent breast tenderness.  This etiology is most likely also due to the presence of bright red blood at the cervical os. Instructed the patient to monitor symptoms for the next month to see if the pain subsides, increases, stays the same, or changes.  Also instructed the patient to monitor her the skin for changes ornipple for any discharge.  Will consider ultrasound of the breast if symptoms persist. Follow-up as needed.  7. Encounter for birth control pills maintenance Patient's menstrual cycles and mood are well controlled with birth control pills.  Pap with HPV was performed today. Refills provided for birth control. Patient to follow-up in the next year or sooner if needed. - Norgestimate-Ethinyl Estradiol Triphasic (TRI-PREVIFEM) 0.18/0.215/0.25 MG-35 MCG tablet; Take 1 tablet by mouth daily.  Dispense: 28 tablet; Refill: 12   Return in about 1 year (around 05/22/2020) for Annual Physical .    Tollie Eth, NP

## 2019-05-24 LAB — CBC WITH DIFFERENTIAL/PLATELET
Absolute Monocytes: 371 cells/uL (ref 200–950)
Basophils Absolute: 38 cells/uL (ref 0–200)
Basophils Relative: 0.6 %
Eosinophils Absolute: 122 cells/uL (ref 15–500)
Eosinophils Relative: 1.9 %
HCT: 40.1 % (ref 35.0–45.0)
Hemoglobin: 13.1 g/dL (ref 11.7–15.5)
Lymphs Abs: 1357 cells/uL (ref 850–3900)
MCH: 27.5 pg (ref 27.0–33.0)
MCHC: 32.7 g/dL (ref 32.0–36.0)
MCV: 84.2 fL (ref 80.0–100.0)
MPV: 12.2 fL (ref 7.5–12.5)
Monocytes Relative: 5.8 %
Neutro Abs: 4512 cells/uL (ref 1500–7800)
Neutrophils Relative %: 70.5 %
Platelets: 208 10*3/uL (ref 140–400)
RBC: 4.76 10*6/uL (ref 3.80–5.10)
RDW: 13 % (ref 11.0–15.0)
Total Lymphocyte: 21.2 %
WBC: 6.4 10*3/uL (ref 3.8–10.8)

## 2019-05-24 LAB — COMPLETE METABOLIC PANEL WITH GFR
AG Ratio: 1.6 (calc) (ref 1.0–2.5)
ALT: 8 U/L (ref 6–29)
AST: 9 U/L — ABNORMAL LOW (ref 10–30)
Albumin: 4.2 g/dL (ref 3.6–5.1)
Alkaline phosphatase (APISO): 65 U/L (ref 31–125)
BUN: 13 mg/dL (ref 7–25)
CO2: 22 mmol/L (ref 20–32)
Calcium: 8.9 mg/dL (ref 8.6–10.2)
Chloride: 104 mmol/L (ref 98–110)
Creat: 0.79 mg/dL (ref 0.50–1.10)
GFR, Est African American: 119 mL/min/{1.73_m2} (ref >=60)
GFR, Est Non African American: 103 mL/min/{1.73_m2} (ref >=60)
Globulin: 2.6 g/dL (calc) (ref 1.9–3.7)
Glucose, Bld: 84 mg/dL (ref 65–99)
Potassium: 4.5 mmol/L (ref 3.5–5.3)
Sodium: 137 mmol/L (ref 135–146)
Total Bilirubin: 0.2 mg/dL (ref 0.2–1.2)
Total Protein: 6.8 g/dL (ref 6.1–8.1)

## 2019-05-26 LAB — CYTOLOGY - PAP

## 2019-09-29 ENCOUNTER — Ambulatory Visit (INDEPENDENT_AMBULATORY_CARE_PROVIDER_SITE_OTHER): Payer: Self-pay | Admitting: Nurse Practitioner

## 2019-09-29 ENCOUNTER — Encounter: Payer: Self-pay | Admitting: Nurse Practitioner

## 2019-09-29 VITALS — BP 116/80 | HR 74 | Temp 98.3°F | Ht 64.25 in | Wt 191.7 lb

## 2019-09-29 DIAGNOSIS — L739 Follicular disorder, unspecified: Secondary | ICD-10-CM

## 2019-09-29 DIAGNOSIS — Z23 Encounter for immunization: Secondary | ICD-10-CM

## 2019-09-29 MED ORDER — MUPIROCIN 2 % EX OINT: TOPICAL_OINTMENT | CUTANEOUS | 3 refills | Status: DC

## 2019-09-29 MED ORDER — CLINDAMYCIN PHOSPHATE 1 % EX GEL: CUTANEOUS | 1 refills | Status: DC

## 2019-09-29 NOTE — Patient Instructions (Addendum)
Folliculitis  Folliculitis is inflammation of the hair follicles. Folliculitis most commonly occurs on the scalp, thighs, legs, back, and buttocks. However, it can occur anywhere on the body. What are the causes? This condition may be caused by:  A bacterial infection (common).  A fungal infection.  A viral infection.  Contact with certain chemicals, especially oils and tars.  Shaving or waxing.  Greasy ointments or creams applied to the skin. Long-lasting folliculitis and folliculitis that keeps coming back may be caused by bacteria. This bacteria can live anywhere on your skin and is often found in the nostrils. What increases the risk? You are more likely to develop this condition if you have:  A weakened immune system.  Diabetes.  Obesity. What are the signs or symptoms? Symptoms of this condition include:  Redness.  Soreness.  Swelling.  Itching.  Small white or yellow, pus-filled, itchy spots (pustules) that appear over a reddened area. If there is an infection that goes deep into the follicle, these may develop into a boil (furuncle).  A group of closely packed boils (carbuncle). These tend to form in hairy, sweaty areas of the body. How is this diagnosed? This condition is diagnosed with a skin exam. To find what is causing the condition, your health care provider may take a sample of one of the pustules or boils for testing in a lab. How is this treated? This condition may be treated by:  Applying warm compresses to the affected areas.  Taking an antibiotic medicine or applying an antibiotic medicine to the skin.  Applying or bathing with an antiseptic solution.  Taking an over-the-counter medicine to help with itching.  Having a procedure to drain any pustules or boils. This may be done if a pustule or boil contains a lot of pus or fluid.  Having laser hair removal. This may be done to treat long-lasting folliculitis. Follow these instructions at  home: Managing pain and swelling   If directed, apply heat to the affected area as often as told by your health care provider. Use the heat source that your health care provider recommends, such as a moist heat pack or a heating pad. ? Place a towel between your skin and the heat source. ? Leave the heat on for 20-30 minutes. ? Remove the heat if your skin turns bright red. This is especially important if you are unable to feel pain, heat, or cold. You may have a greater risk of getting burned. General instructions  If you were prescribed an antibiotic medicine, take it or apply it as told by your health care provider. Do not stop using the antibiotic even if your condition improves.  Check the irritated area every day for signs of infection. Check for: ? Redness, swelling, or pain. ? Fluid or blood. ? Warmth. ? Pus or a bad smell.  Do not shave irritated skin.  Take over-the-counter and prescription medicines only as told by your health care provider.  Keep all follow-up visits as told by your health care provider. This is important. Get help right away if:  You have more redness, swelling, or pain in the affected area.  Red streaks are spreading from the affected area.  You have a fever. Summary  Folliculitis is inflammation of the hair follicles. Folliculitis most commonly occurs on the scalp, thighs, legs, back, and buttocks.  This condition may be treated by taking an antibiotic medicine or applying an antibiotic medicine to the skin, and applying or bathing with an antiseptic  solution.  If you were prescribed an antibiotic medicine, take it or apply it as told by your health care provider. Do not stop using the antibiotic even if your condition improves.  Get help right away if you have new or worsening symptoms.  Keep all follow-up visits as told by your health care provider. This is important. This information is not intended to replace advice given to you by your  health care provider. Make sure you discuss any questions you have with your health care provider. Document Revised: 08/25/2017 Document Reviewed: 08/25/2017 Elsevier Patient Education  2020 Else[employer] Inc.   Influenza (Flu) Vaccine (Inactivated or Recombinant): What You Need to Know 1. Why get vaccinated? Influenza vaccine can prevent influenza (flu). Flu is a contagious disease that spreads around the [place] every year, usually between October and May. Anyone can get the flu, but it is more dangerous for some people. Infants and young children, people 60 years of age and older, pregnant women, and people with certain health conditions or a weakened immune system are at greatest risk of flu complications. Pneumonia, bronchitis, sinus infections and ear infections are examples of flu-related complications. If you have a medical condition, such as heart disease, cancer or diabetes, flu can make it worse. Flu can cause fever and chills, sore throat, muscle aches, fatigue, cough, headache, and runny or stuffy nose. Some people may have vomiting and diarrhea, though this is more common in children than adults. Each year thousands of people in the [place] die from flu, and many more are hospitalized. Flu vaccine prevents millions of illnesses and flu-related visits to the doctor each year. 2. Influenza vaccine CDC recommends everyone 14 months of age and older get vaccinated every flu season. Children 6 months through 72 years of age may need 2 doses during a single flu season. Everyone else needs only 1 dose each flu season. It takes about 2 weeks for protection to develop after vaccination. There are many flu viruses, and they are always changing. Each year a new flu vaccine is made to protect against three or four viruses that are likely to cause disease in the upcoming flu season. Even when the vaccine doesn't exactly match these viruses, it may still provide some protection. Influenza  vaccine does not cause flu. Influenza vaccine may be given at the same time as other vaccines. 3. Talk with your health care provider Tell your vaccine provider if the person getting the vaccine:  Has had an allergic reaction after a previous dose of influenza vaccine, or has any severe, life-threatening allergies.  Has ever had Guillain-Barr Syndrome (also called GBS). In some cases, your health care provider may decide to postpone influenza vaccination to a future visit. People with minor illnesses, such as a cold, may be vaccinated. People who are moderately or severely ill should usually wait until they recover before getting influenza vaccine. Your health care provider can give you more information. 4. Risks of a vaccine reaction  Soreness, redness, and swelling where shot is given, fever, muscle aches, and headache can happen after influenza vaccine.  There may be a very small increased risk of Guillain-Barr Syndrome (GBS) after inactivated influenza vaccine (the flu shot). Young children who get the flu shot along with pneumococcal vaccine (PCV13), and/or DTaP vaccine at the same time might be slightly more likely to have a seizure caused by fever. Tell your health care provider if a child who is getting flu vaccine has ever had a seizure. People  sometimes faint after medical procedures, including vaccination. Tell your provider if you feel dizzy or have vision changes or ringing in the ears. As with any medicine, there is a very remote chance of a vaccine causing a severe allergic reaction, other serious injury, or death. 5. What if there is a serious problem? An allergic reaction could occur after the vaccinated person leaves the clinic. If you see signs of a severe allergic reaction (hives, swelling of the face and throat, difficulty breathing, a fast heartbeat, dizziness, or weakness), call 9-1-1 and get the person to the nearest hospital. For other signs that concern you, call your  health care provider. Adverse reactions should be reported to the Vaccine Adverse Event Reporting System (VAERS). Your health care provider will usually file this report, or you can do it yourself. Visit the VAERS website at www.vaers.LAgents.no or call 202-599-7257.VAERS is only for reporting reactions, and VAERS staff do not give medical advice. 6. The National Vaccine Injury Compensation Program The [employer] Vaccine Injury Compensation Program (VICP) is a federal program that was created to compensate people who may have been injured by certain vaccines. Visit the VICP website at SpiritualWord.at or call (352) 525-4807 to learn about the program and about filing a claim. There is a time limit to file a claim for compensation. 7. How can I learn more?  Ask your healthcare provider.  Call your local or state health department.  Contact the Centers for Disease Control and Prevention (CDC): ? Call 231-484-2532 (1-800-CDC-INFO) or ? Visit CDC's BiotechRoom.com.cy Vaccine Information Statement (Interim) Inactivated Influenza Vaccine (09/13/2017) This information is not intended to replace advice given to you by your health care provider. Make sure you discuss any questions you have with your health care provider. Document Revised: 05/07/2018 Document Reviewed: 09/17/2017 Elsevier Patient Education  2020 [employer].

## 2019-09-29 NOTE — Progress Notes (Signed)
Acute Office Visit  Subjective:    Patient ID: Erin Ashley, female    DOB: 03-29-1990, 29 y.o.   MRN: [id number]  Chief Complaint  Patient presents with  . Cyst    was in a hot tub a week ago, started breaking out in bumps on her side and bottom, has not been putting anything on the spots    HPI Patient is in today for small bumps scattered on her body that started arising last week after spending time in a hot tub with friends. She reports at least one other person in the hot tub has similar symptoms.  She reports scattered pink papules on her arms, thighs, abdomen, and buttocks. She states that the last area showed up on Friday of last week. She reports some of the areas are healing on their own, while others remain. The areas on her buttocks have opened on the skin surface due to friction form her clothing.   She denies purulent drainage, large areas of involvement or large pustules, fever, chills, night sweats, nausea, vomiting, or diarrhea.   She has not tried anything on the area.  Past Medical History:  Diagnosis Date  . Anxiety   . Chronic cholecystitis   . Depression   . GERD (gastroesophageal reflux disease)   . Headache    allergy headaches    Past Surgical History:  Procedure Laterality Date  . CHOLECYSTECTOMY N/A 03/08/2017   Procedure: LAPAROSCOPIC CHOLECYSTECTOMY WITH INTRAOPERATIVE CHOLANGIOGRAM;  Surgeon: Darnell Level, MD;  Location: [facility];  Service: General;  Laterality: N/A;  . TONSILLECTOMY  as child   and adenoids  . WISDOM TOOTH EXTRACTION      Family History  Problem Relation Age of Onset  . Hyperlipidemia Father   . Hypertension Father   . Diabetes Father   . Hypertension Paternal Grandmother   . Hyperlipidemia Paternal Grandmother   . Hyperlipidemia Paternal Grandfather   . Hypertension Paternal Grandfather     Social History   Socioeconomic History  . Marital status: Single    Spouse name: Not on file  . Number of children: Not on file   . Years of education: Not on file  . Highest education level: Not on file  Occupational History  . Not on file  Tobacco Use  . Smoking status: Never Smoker  . Smokeless tobacco: Never Used  Vaping Use  . Vaping Use: Never used  Substance and Sexual Activity  . Alcohol use: Yes    Comment: Occasionally  . Drug use: No  . Sexual activity: Yes    Partners: Male    Birth control/protection: Pill  Other Topics Concern  . Not on file  Social History Narrative  . Not on file   Social Determinants of Health   Financial Resource Strain:   . Difficulty of Paying Living Expenses: Not on file  Food Insecurity:   . Worried About Programme researcher, broadcasting/film/video in the Last Year: Not on file  . Ran Out of Food in the Last Year: Not on file  Transportation Needs:   . Lack of Transportation (Medical): Not on file  . Lack of Transportation (Non-Medical): Not on file  Physical Activity:   . Days of Exercise per Week: Not on file  . Minutes of Exercise per Session: Not on file  Stress:   . Feeling of Stress : Not on file  Social Connections:   . Frequency of Communication with Friends and Family: Not on file  . Frequency of Social  Gatherings with Friends and Family: Not on file  . Attends Religious Services: Not on file  . Active Member of Clubs or Organizations: Not on file  . Attends Banker Meetings: Not on file  . Marital Status: Not on file  Intimate Partner Violence:   . Fear of Current or Ex-Partner: Not on file  . Emotionally Abused: Not on file  . Physically Abused: Not on file  . Sexually Abused: Not on file    Outpatient Medications Prior to Visit  Medication Sig Dispense Refill  . acetaminophen (TYLENOL) 500 MG tablet Take 1,000 mg by mouth every 6 (six) hours as needed (for pain.).    Marland Kitchen cetirizine (ZYRTEC) 10 MG tablet Take 10 mg by mouth daily.    Marland Kitchen escitalopram (LEXAPRO) 10 MG tablet Take 1 tablet (10 mg total) by mouth daily. 90 tablet 3  . Fexofenadine HCl  (MUCINEX ALLERGY PO) Take 1 tablet by mouth daily as needed.    . fluticasone (FLONASE) 50 MCG/ACT nasal spray One spray in each nostril twice a day, use left hand for right nostril, and right hand for left nostril. (Patient taking differently: Place 1 spray into both nostrils daily. One spray in each nostril twice a day, use left hand for right nostril, and right hand for left nostril.) 48 g 3  . Norgestimate-Ethinyl Estradiol Triphasic (TRI-PREVIFEM) 0.18/0.215/0.25 MG-35 MCG tablet Take 1 tablet by mouth daily. 28 tablet 12  . ranitidine (ZANTAC) 150 MG capsule Take 1 capsule (150 mg total) by mouth every evening. 30 capsule 2  . ipratropium (ATROVENT) 0.06 % nasal spray Place 1 spray into both nostrils 4 (four) times daily as needed. (Patient not taking: Reported on 09/29/2019) 15 mL 0  . omeprazole (PRILOSEC) 40 MG capsule Take 1 capsule (40 mg total) by mouth daily. (Patient not taking: Reported on 09/29/2019) 30 capsule 2  . ondansetron (ZOFRAN) 8 MG tablet Take 1 tablet (8 mg total) by mouth every 8 (eight) hours as needed for nausea or vomiting. (Patient not taking: Reported on 05/23/2019) 20 tablet 0   No facility-administered medications prior to visit.    Allergies  Allergen Reactions  . Amoxicillin Anaphylaxis and Swelling    Has patient had a PCN reaction causing immediate rash, facial/tongue/throat swelling, SOB or lightheadedness with hypotension: Yes Has patient had a PCN reaction causing severe rash involving mucus membranes or skin necrosis: No Has patient had a PCN reaction that required hospitalization: No Has patient had a PCN reaction occurring within the last 10 years: Yes Throat swelling If all of the above answers are "NO", then may proceed with Cephalosporin use.   Marland Kitchen Penicillins Rash    REACTION: rash around mouth Has patient had a PCN reaction causing immediate rash, facial/tongue/throat swelling, SOB or lightheadedness with hypotension: No Has patient had a PCN  reaction causing severe rash involving mucus membranes or skin necrosis: No Has patient had a PCN reaction that required hospitalization: No Has patient had a PCN reaction occurring within the last 10 years: No If all of the above answers are "NO", then may proceed with Cephalosporin use.        Objective:    Physical Exam Vitals and nursing note reviewed.  Constitutional:      Appearance: Normal appearance.  HENT:     Head: Normocephalic.  Eyes:     Extraocular Movements: Extraocular movements intact.     Conjunctiva/sclera: Conjunctivae normal.     Pupils: Pupils are equal, round, and reactive to  light.  Cardiovascular:     Rate and Rhythm: Normal rate and regular rhythm.     Pulses: Normal pulses.  Pulmonary:     Effort: Pulmonary effort is normal.  Abdominal:     General: Abdomen is flat.  Musculoskeletal:        General: Normal range of motion.     Cervical back: Normal range of motion.  Skin:    General: Skin is warm and dry.     Capillary Refill: Capillary refill takes less than 2 seconds.     Findings: Rash present. Rash is papular.     Comments: Scattered erythematous papular lesions approximately 1-4cm in size on arms, abdomen, and buttocks. The right buttock is the most affected area at this time. No streaking, drainage, or warmth noted.   Neurological:     General: No focal deficit present.     Mental Status: She is alert and oriented to person, place, and time.  Psychiatric:        Mood and Affect: Mood normal.        Behavior: Behavior normal.        Thought Content: Thought content normal.        Judgment: Judgment normal.     BP 116/80   Pulse 74   Temp 98.3 F (36.8 C) (Oral)   Ht 5' 4.25" (1.632 m)   Wt 191 lb 11.2 oz (87 kg)   LMP 09/03/2019   SpO2 100%   BMI 32.65 kg/m  Wt Readings from Last 3 Encounters:  09/29/19 191 lb 11.2 oz (87 kg)  05/23/19 186 lb (84.4 kg)  01/06/19 182 lb (82.6 kg)    There are no preventive care reminders to  display for this patient.  There are no preventive care reminders to display for this patient.   Lab Results  Component Value Date   TSH 1.32 01/31/2017   Lab Results  Component Value Date   WBC 6.4 05/23/2019   HGB 13.1 05/23/2019   HCT 40.1 05/23/2019   MCV 84.2 05/23/2019   PLT 208 05/23/2019   Lab Results  Component Value Date   NA 137 05/23/2019   K 4.5 05/23/2019   CO2 22 05/23/2019   GLUCOSE 84 05/23/2019   BUN 13 05/23/2019   CREATININE 0.79 05/23/2019   BILITOT 0.2 05/23/2019   ALKPHOS 62 07/19/2012   AST 9 (L) 05/23/2019   ALT 8 05/23/2019   PROT 6.8 05/23/2019   ALBUMIN 4.4 07/19/2012   CALCIUM 8.9 05/23/2019   Lab Results  Component Value Date   CHOL 166 07/19/2012   Lab Results  Component Value Date   HDL 71 07/19/2012   Lab Results  Component Value Date   LDLCALC 84 07/19/2012   Lab Results  Component Value Date   TRIG 57 07/19/2012   Lab Results  Component Value Date   CHOLHDL 2.3 07/19/2012   No results found for: HGBA1C     Assessment & Plan:   Problem List Items Addressed This Visit      Musculoskeletal and Integument   Folliculitis - Primary    Symptoms and presentation consistent with folliculitis, most likely resulting from exposure to bacteria from recent hot tub encounter.  Given that the areas do not have purulent drainage present, recommend conservative management with topical antimicrobials.  Recommend showering daily and using a clean towel and wash cloth with each shower to prevent reinfection.  Avoid hot tubs and swimming pools until areas have cleared completely.  Avoid scratching, rubbing, or picking at locations to prevent infection.  If symptoms continue or new areas continue to appear after Wednesday of this week, will consider treatment with oral doxycycline for systemic relief.  Follow-up if symptoms worsen or fail to improve.       Relevant Medications   clindamycin (CLINDAGEL) 1 % gel   mupirocin ointment  (BACTROBAN) 2 %    Other Visit Diagnoses    Need for influenza vaccination       Relevant Orders   Flu Vaccine QUAD 36+ mos IM (Completed)     Patient also presenting with questions and advice on COVID-19 vaccine.  Discussed risks and benefits of the vaccine and provided patient with CDC website to review most up to date and appropriate information available.   Meds ordered this encounter  Medications  . clindamycin (CLINDAGEL) 1 % gel    Sig: Apply to infected areas 3 times a day for 7 days.    Dispense:  30 g    Refill:  1  . mupirocin ointment (BACTROBAN) 2 %    Sig: Apply to affected area TID for 7 days.    Dispense:  30 g    Refill:  3   Return if symptoms worsen or fail to improve.   Tollie Eth, NP

## 2019-09-29 NOTE — Assessment & Plan Note (Signed)
Symptoms and presentation consistent with folliculitis, most likely resulting from exposure to bacteria from recent hot tub encounter.  Given that the areas do not have purulent drainage present, recommend conservative management with topical antimicrobials.  Recommend showering daily and using a clean towel and wash cloth with each shower to prevent reinfection.  Avoid hot tubs and swimming pools until areas have cleared completely.  Avoid scratching, rubbing, or picking at locations to prevent infection.  If symptoms continue or new areas continue to appear after Wednesday of this week, will consider treatment with oral doxycycline for systemic relief.  Follow-up if symptoms worsen or fail to improve.

## 2020-08-23 ENCOUNTER — Other Ambulatory Visit: Payer: Self-pay | Admitting: Nurse Practitioner

## 2020-08-23 DIAGNOSIS — F341 Dysthymic disorder: Secondary | ICD-10-CM

## 2020-08-23 DIAGNOSIS — Z3041 Encounter for surveillance of contraceptive pills: Secondary | ICD-10-CM

## 2020-08-23 DIAGNOSIS — F3342 Major depressive disorder, recurrent, in full remission: Secondary | ICD-10-CM

## 2020-08-25 NOTE — Telephone Encounter (Signed)
Appt made for 09-17-20

## 2020-08-25 NOTE — Telephone Encounter (Signed)
Patient needs appt/ Birth control refills/ 30 days sent.

## 2020-09-15 ENCOUNTER — Other Ambulatory Visit: Payer: Self-pay | Admitting: Physician Assistant

## 2020-09-15 DIAGNOSIS — F3342 Major depressive disorder, recurrent, in full remission: Secondary | ICD-10-CM

## 2020-09-15 DIAGNOSIS — Z3041 Encounter for surveillance of contraceptive pills: Secondary | ICD-10-CM

## 2020-09-15 DIAGNOSIS — F341 Dysthymic disorder: Secondary | ICD-10-CM

## 2020-09-17 ENCOUNTER — Other Ambulatory Visit: Payer: Self-pay

## 2020-09-17 ENCOUNTER — Ambulatory Visit: Payer: 59 | Admitting: Physician Assistant

## 2020-09-17 ENCOUNTER — Encounter: Payer: Self-pay | Admitting: Physician Assistant

## 2020-09-17 VITALS — BP 129/88 | HR 92 | Ht 64.25 in | Wt 210.0 lb

## 2020-09-17 DIAGNOSIS — Z0000 Encounter for general adult medical examination without abnormal findings: Secondary | ICD-10-CM | POA: Diagnosis not present

## 2020-09-17 DIAGNOSIS — Z13220 Encounter for screening for lipoid disorders: Secondary | ICD-10-CM | POA: Diagnosis not present

## 2020-09-17 DIAGNOSIS — Z131 Encounter for screening for diabetes mellitus: Secondary | ICD-10-CM

## 2020-09-17 DIAGNOSIS — E6609 Other obesity due to excess calories: Secondary | ICD-10-CM | POA: Insufficient documentation

## 2020-09-17 DIAGNOSIS — F341 Dysthymic disorder: Secondary | ICD-10-CM | POA: Insufficient documentation

## 2020-09-17 DIAGNOSIS — Z130 Encounter for screening for diseases of the blood and blood-forming organs and certain disorders involving the immune mechanism: Secondary | ICD-10-CM | POA: Diagnosis not present

## 2020-09-17 DIAGNOSIS — Z3041 Encounter for surveillance of contraceptive pills: Secondary | ICD-10-CM

## 2020-09-17 DIAGNOSIS — Z1329 Encounter for screening for other suspected endocrine disorder: Secondary | ICD-10-CM

## 2020-09-17 DIAGNOSIS — Z6835 Body mass index (BMI) 35.0-35.9, adult: Secondary | ICD-10-CM

## 2020-09-17 LAB — CBC WITH DIFFERENTIAL/PLATELET
Absolute Monocytes: 367 cells/uL (ref 200–950)
Basophils Absolute: 48 cells/uL (ref 0–200)
Basophils Relative: 0.7 %
Eosinophils Absolute: 129 cells/uL (ref 15–500)
Eosinophils Relative: 1.9 %
HCT: 39.6 % (ref 35.0–45.0)
Hemoglobin: 12.9 g/dL (ref 11.7–15.5)
Lymphs Abs: 1380 cells/uL (ref 850–3900)
MCH: 27.2 pg (ref 27.0–33.0)
MCHC: 32.6 g/dL (ref 32.0–36.0)
MCV: 83.4 fL (ref 80.0–100.0)
MPV: 11.7 fL (ref 7.5–12.5)
Monocytes Relative: 5.4 %
Neutro Abs: 4876 cells/uL (ref 1500–7800)
Neutrophils Relative %: 71.7 %
Platelets: 225 10*3/uL (ref 140–400)
RBC: 4.75 10*6/uL (ref 3.80–5.10)
RDW: 14.2 % (ref 11.0–15.0)
Total Lymphocyte: 20.3 %
WBC: 6.8 10*3/uL (ref 3.8–10.8)

## 2020-09-17 LAB — LIPID PANEL W/REFLEX DIRECT LDL
Cholesterol: 163 mg/dL (ref <200)
HDL: 57 mg/dL (ref >=50)
LDL Cholesterol (Calc): 83 mg/dL (calc)
Non-HDL Cholesterol (Calc): 106 mg/dL (calc) (ref <130)
Total CHOL/HDL Ratio: 2.9 (calc) (ref <5.0)
Triglycerides: 136 mg/dL (ref <150)

## 2020-09-17 LAB — COMPLETE METABOLIC PANEL WITH GFR
AG Ratio: 1.7 (calc) (ref 1.0–2.5)
ALT: 12 U/L (ref 6–29)
AST: 10 U/L (ref 10–30)
Albumin: 4.2 g/dL (ref 3.6–5.1)
Alkaline phosphatase (APISO): 87 U/L (ref 31–125)
BUN: 9 mg/dL (ref 7–25)
CO2: 26 mmol/L (ref 20–32)
Calcium: 9 mg/dL (ref 8.6–10.2)
Chloride: 106 mmol/L (ref 98–110)
Creat: 0.8 mg/dL (ref 0.50–0.96)
Globulin: 2.5 g/dL (calc) (ref 1.9–3.7)
Glucose, Bld: 87 mg/dL (ref 65–99)
Potassium: 4.4 mmol/L (ref 3.5–5.3)
Sodium: 140 mmol/L (ref 135–146)
Total Bilirubin: 0.3 mg/dL (ref 0.2–1.2)
Total Protein: 6.7 g/dL (ref 6.1–8.1)
eGFR: 102 mL/min/{1.73_m2} (ref >=60)

## 2020-09-17 LAB — TSH: TSH: 1.77 mIU/L

## 2020-09-17 MED ORDER — NORGESTIM-ETH ESTRAD TRIPHASIC 0.18/0.215/0.25 MG-35 MCG PO TABS: 1.0000 | ORAL_TABLET | Freq: Every day | ORAL | 4 refills | Status: DC

## 2020-09-17 NOTE — Patient Instructions (Signed)
Health Maintenance, Female Adopting a healthy lifestyle and getting preventive care are important in promoting health and wellness. Ask your health care provider about: The right schedule for you to have regular tests and exams. Things you can do on your own to prevent diseases and keep yourself healthy. What should I know about diet, weight, and exercise? Eat a healthy diet  Eat a diet that includes plenty of vegetables, fruits, low-fat dairy products, and lean protein. Do not eat a lot of foods that are high in solid fats, added sugars, or sodium.  Maintain a healthy weight Body mass index (BMI) is used to identify weight problems. It estimates body fat based on height and weight. Your health care provider can help determineyour BMI and help you achieve or maintain a healthy weight. Get regular exercise Get regular exercise. This is one of the most important things you can do for your health. Most adults should: Exercise for at least 150 minutes each week. The exercise should increase your heart rate and make you sweat (moderate-intensity exercise). Do strengthening exercises at least twice a week. This is in addition to the moderate-intensity exercise. Spend less time sitting. Even light physical activity can be beneficial. Watch cholesterol and blood lipids Have your blood tested for lipids and cholesterol at 29 years of age, then havethis test every 5 years. Have your cholesterol levels checked more often if: Your lipid or cholesterol levels are high. You are older than 29 years of age. You are at high risk for heart disease. What should I know about cancer screening? Depending on your health history and family history, you may need to have cancer screening at various ages. This may include screening for: Breast cancer. Cervical cancer. Colorectal cancer. Skin cancer. Lung cancer. What should I know about heart disease, diabetes, and high blood pressure? Blood pressure and heart  disease High blood pressure causes heart disease and increases the risk of stroke. This is more likely to develop in people who have high blood pressure readings, are of African descent, or are overweight. Have your blood pressure checked: Every 3-5 years if you are 18-39 years of age. Every year if you are 40 years old or older. Diabetes Have regular diabetes screenings. This checks your fasting blood sugar level. Have the screening done: Once every three years after age 40 if you are at a normal weight and have a low risk for diabetes. More often and at a younger age if you are overweight or have a high risk for diabetes. What should I know about preventing infection? Hepatitis B If you have a higher risk for hepatitis B, you should be screened for this virus. Talk with your health care provider to find out if you are at risk forhepatitis B infection. Hepatitis C Testing is recommended for: Everyone born from 1945 through 1965. Anyone with known risk factors for hepatitis C. Sexually transmitted infections (STIs) Get screened for STIs, including gonorrhea and chlamydia, if: You are sexually active and are younger than 29 years of age. You are older than 29 years of age and your health care provider tells you that you are at risk for this type of infection. Your sexual activity has changed since you were last screened, and you are at increased risk for chlamydia or gonorrhea. Ask your health care provider if you are at risk. Ask your health care provider about whether you are at high risk for HIV. Your health care provider may recommend a prescription medicine to help   prevent HIV infection. If you choose to take medicine to prevent HIV, you should first get tested for HIV. You should then be tested every 3 months for as long as you are taking the medicine. Pregnancy If you are about to stop having your period (premenopausal) and you may become pregnant, seek counseling before you get  pregnant. Take 400 to 800 micrograms (mcg) of folic acid every day if you become pregnant. Ask for birth control (contraception) if you want to prevent pregnancy. Osteoporosis and menopause Osteoporosis is a disease in which the bones lose minerals and strength with aging. This can result in bone fractures. If you are 65 years old or older, or if you are at risk for osteoporosis and fractures, ask your health care provider if you should: Be screened for bone loss. Take a calcium or vitamin D supplement to lower your risk of fractures. Be given hormone replacement therapy (HRT) to treat symptoms of menopause. Follow these instructions at home: Lifestyle Do not use any products that contain nicotine or tobacco, such as cigarettes, e-cigarettes, and chewing tobacco. If you need help quitting, ask your health care provider. Do not use street drugs. Do not share needles. Ask your health care provider for help if you need support or information about quitting drugs. Alcohol use Do not drink alcohol if: Your health care provider tells you not to drink. You are pregnant, may be pregnant, or are planning to become pregnant. If you drink alcohol: Limit how much you use to 0-1 drink a day. Limit intake if you are breastfeeding. Be aware of how much alcohol is in your drink. In the [place] one drink equals one 12 oz bottle of beer (355 mL), one 5 oz glass of wine (148 mL), or one 1 oz glass of hard liquor (44 mL). General instructions Schedule regular health, dental, and eye exams. Stay current with your vaccines. Tell your health care provider if: You often feel depressed. You have ever been abused or do not feel safe at home. Summary Adopting a healthy lifestyle and getting preventive care are important in promoting health and wellness. Follow your health care provider's instructions about healthy diet, exercising, and getting tested or screened for diseases. Follow your health care provider's  instructions on monitoring your cholesterol and blood pressure. This information is not intended to replace advice given to you by your health care provider. Make sure you discuss any questions you have with your healthcare provider. Document Revised: 01/09/2018 Document Reviewed: 01/09/2018 Elsevier Patient Education  2022 Elsevier Inc.  

## 2020-09-17 NOTE — Progress Notes (Signed)
Subjective:     Erin Ashley is a 30 y.o. female and is here for a comprehensive physical exam. The patient reports no problems.  Social History   Socioeconomic History   Marital status: Single    Spouse name: Not on file   Number of children: Not on file   Years of education: Not on file   Highest education level: Not on file  Occupational History   Not on file  Tobacco Use   Smoking status: Never   Smokeless tobacco: Never  Vaping Use   Vaping Use: Never used  Substance and Sexual Activity   Alcohol use: Yes    Comment: Occasionally   Drug use: No   Sexual activity: Yes    Partners: Male    Birth control/protection: Pill  Other Topics Concern   Not on file  Social History Narrative   Not on file   Social Determinants of Health   Financial Resource Strain: Not on file  Food Insecurity: Not on file  Transportation Needs: Not on file  Physical Activity: Not on file  Stress: Not on file  Social Connections: Not on file  Intimate Partner Violence: Not on file   Health Maintenance  Topic Date Due   HIV Screening  Never done   Hepatitis C Screening  Never done   INFLUENZA VACCINE  08/30/2020   COVID-19 Vaccine (1) 10/03/2020 (Originally 06/22/1996)   PAP-Cervical Cytology Screening  05/23/2022   PAP SMEAR-Modifier  05/23/2022   TETANUS/TDAP  09/10/2024   Pneumococcal Vaccine 53-38 Years old  Aged Out   HPV VACCINES  Aged Out    The following portions of the patient's history were reviewed and updated as appropriate: allergies, current medications, past family history, past medical history, past social history, past surgical history, and problem list.  Review of Systems A comprehensive review of systems was negative.   Objective:    BP 129/88   Pulse 92   Ht 5' 4.25" (1.632 m)   Wt 210 lb (95.3 kg)   SpO2 99%   BMI 35.77 kg/m  General appearance: alert, cooperative, and appears stated age Head: Normocephalic, without obvious abnormality,  atraumatic Eyes: conjunctivae/corneas clear. PERRL, EOM's intact. Fundi benign. Ears: normal TM's and external ear canals both ears Nose: Nares normal. Septum midline. Mucosa normal. No drainage or sinus tenderness. Throat: lips, mucosa, and tongue normal; teeth and gums normal Neck: no adenopathy, no carotid bruit, no JVD, supple, symmetrical, trachea midline, and thyroid not enlarged, symmetric, no tenderness/mass/nodules Back: symmetric, no curvature. ROM normal. No CVA tenderness. Lungs: clear to auscultation bilaterally Heart: regular rate and rhythm, S1, S2 normal, no murmur, click, rub or gallop Abdomen: soft, non-tender; bowel sounds normal; no masses,  no organomegaly Extremities: extremities normal, atraumatic, no cyanosis or edema Pulses: 2+ and symmetric Skin: Skin color, texture, turgor normal. No rashes or lesions Lymph nodes: Cervical, supraclavicular, and axillary nodes normal. Neurologic: Alert and oriented X 3, normal strength and tone. Normal symmetric reflexes. Normal coordination and gait   .Marland Kitchen Depression screen Erin Ashley 2/9 09/17/2020 05/23/2019 02/28/2017 01/31/2017 05/05/2015  Decreased Interest 0 0 - 3 1  Down, Depressed, Hopeless 0 0 0 3 1  PHQ - 2 Score 0 0 0 6 2  Altered sleeping 0 1 0 3 0  Tired, decreased energy 0 1 0 3 0  Change in appetite 0 0 0 3 0  Feeling bad or failure about yourself  0 0 0 2 1  Trouble concentrating 0 0 0 2  0  Moving slowly or fidgety/restless 0 0 0 3 0  Suicidal thoughts 0 0 0 0 0  PHQ-9 Score 0 2 0 22 3  Difficult doing work/chores Not difficult at all Not difficult at all Not difficult at all Not difficult at all -   .. GAD 7 : Generalized Anxiety Score 09/17/2020 05/23/2019 02/28/2017 01/31/2017  Nervous, Anxious, on Edge 0 0 0 3  Control/stop worrying 0 0 0 3  Worry too much - different things 0 0 0 3  Trouble relaxing 0 0 0 3  Restless 0 0 0 1  Easily annoyed or irritable 0 0 0 1  Afraid - awful might happen 0 0 0 1  Total GAD 7 Score 0  0 0 15  Anxiety Difficulty Not difficult at all Not difficult at all - -     Assessment:    Healthy female exam.     Plan:  Marland KitchenMarland KitchenMayar was seen today for follow-up.  Diagnoses and all orders for this visit:  Preventative health care  Screening for iron deficiency anemia -     CBC with Differential/Platelet  Screening for diabetes mellitus -     COMPLETE METABOLIC PANEL WITH GFR  Screening, lipid -     Lipid Panel w/reflex Direct LDL  Screening for thyroid disorder -     TSH  Dysthymia -     Norgestimate-Ethinyl Estradiol Triphasic (TRI-ESTARYLLA) 0.18/0.215/0.25 MG-35 MCG tablet; Take 1 tablet by mouth daily.  Encounter for birth control pills maintenance -     Norgestimate-Ethinyl Estradiol Triphasic (TRI-ESTARYLLA) 0.18/0.215/0.25 MG-35 MCG tablet; Take 1 tablet by mouth daily.  .. Discussed 150 minutes of exercise a week.  Encouraged vitamin D 1000 units and Calcium 1300mg  or 4 servings of dairy a day.  PHQ/GAD look great. Not on medications.  Pap UTD. Declined STD screening.  OCP refilled.  Flu shot recommended.  Declined covid vaccine.     See After Visit Summary for Counseling Recommendations

## 2020-09-20 NOTE — Progress Notes (Signed)
Erin Ashley,   Cholesterol looks great.  Thyroid wonderful.  Kidney, liver, glucose, and hemoglobin look great.

## 2021-12-05 ENCOUNTER — Other Ambulatory Visit: Payer: Self-pay | Admitting: Physician Assistant

## 2021-12-05 DIAGNOSIS — Z3041 Encounter for surveillance of contraceptive pills: Secondary | ICD-10-CM

## 2021-12-05 DIAGNOSIS — F341 Dysthymic disorder: Secondary | ICD-10-CM

## 2021-12-19 ENCOUNTER — Other Ambulatory Visit: Payer: Self-pay | Admitting: Physician Assistant

## 2021-12-19 DIAGNOSIS — F341 Dysthymic disorder: Secondary | ICD-10-CM

## 2021-12-19 DIAGNOSIS — Z3041 Encounter for surveillance of contraceptive pills: Secondary | ICD-10-CM

## 2022-09-06 ENCOUNTER — Ambulatory Visit: Payer: 59 | Admitting: Family Medicine

## 2022-09-06 DIAGNOSIS — F341 Dysthymic disorder: Secondary | ICD-10-CM

## 2022-09-06 DIAGNOSIS — Z3041 Encounter for surveillance of contraceptive pills: Secondary | ICD-10-CM | POA: Diagnosis not present

## 2022-09-06 MED ORDER — NORGESTIM-ETH ESTRAD TRIPHASIC 0.18/0.215/0.25 MG-35 MCG PO TABS: 1.0000 | ORAL_TABLET | Freq: Every day | ORAL | 3 refills | Status: DC

## 2022-09-06 NOTE — Assessment & Plan Note (Addendum)
-   refilled birth control. Pt due for pap and would like to follow up with pcp - no missed doses. Did counsel on use of birth control and side effects

## 2022-09-06 NOTE — Progress Notes (Signed)
Established patient visit   Patient: Erin Ashley   DOB: 06/05/1990   32 y.o. Female  MRN: [id number] Visit Date: 09/06/2022  Today's healthcare provider: Charlton Amor, DO   Chief Complaint  Patient presents with   Contraception    [facility] refill    SUBJECTIVE    Chief Complaint  Patient presents with   Contraception    BC refill   HPI  Pt presents for refill on birth control. Has not missed any doses. Is a non smoker. No issues with birth control.   Review of Systems  Constitutional:  Negative for activity change, fatigue and fever.  Respiratory:  Negative for cough and shortness of breath.   Cardiovascular:  Negative for chest pain.  Gastrointestinal:  Negative for abdominal pain.  Genitourinary:  Negative for difficulty urinating.       Current Meds  Medication Sig   acetaminophen (TYLENOL) 500 MG tablet Take 1,000 mg by mouth every 6 (six) hours as needed (for pain.).   clindamycin (CLINDAGEL) 1 % gel Apply to infected areas 3 times a day for 7 days.   Fexofenadine HCl (MUCINEX ALLERGY PO) Take 1 tablet by mouth daily as needed.   fluticasone (FLONASE) 50 MCG/ACT nasal spray One spray in each nostril twice a day, use left hand for right nostril, and right hand for left nostril. (Patient taking differently: Place 1 spray into both nostrils daily. One spray in each nostril twice a day, use left hand for right nostril, and right hand for left nostril.)   mupirocin ointment (BACTROBAN) 2 % Apply to affected area TID for 7 days.   [DISCONTINUED] Norgestimate-Ethinyl Estradiol Triphasic (TRI-ESTARYLLA) 0.18/0.215/0.25 MG-35 MCG tablet Take 1 tablet by mouth daily. Appt for further refills    OBJECTIVE    BP 122/85 (BP Location: Left Arm, Patient Position: Sitting, Cuff Size: Normal)   Pulse 77   Resp 18   Ht 5\' 5"  (1.651 m)   Wt 210 lb 8 oz (95.5 kg)   SpO2 99%   BMI 35.03 kg/m   Physical Exam Vitals and nursing note reviewed.  Constitutional:      General:  She is not in acute distress.    Appearance: Normal appearance.  HENT:     Head: Normocephalic and atraumatic.     Right Ear: External ear normal.     Left Ear: External ear normal.     Nose: Nose normal.  Eyes:     Conjunctiva/sclera: Conjunctivae normal.  Cardiovascular:     Rate and Rhythm: Normal rate and regular rhythm.  Pulmonary:     Effort: Pulmonary effort is normal.     Breath sounds: Normal breath sounds.  Neurological:     General: No focal deficit present.     Mental Status: She is alert and oriented to person, place, and time.  Psychiatric:        Mood and Affect: Mood normal.        Behavior: Behavior normal.        Thought Content: Thought content normal.        Judgment: Judgment normal.          ASSESSMENT & PLAN    Problem List Items Addressed This Visit       Other   Dysthymia    - refilled birth control. Pt due for pap and would like to follow up with pcp - no missed doses. Did counsel on use of birth control and side effects  Relevant Medications   Norgestimate-Ethinyl Estradiol Triphasic (TRI-ESTARYLLA) 0.18/0.215/0.25 MG-35 MCG tablet   Other Visit Diagnoses     Encounter for birth control pills maintenance       Relevant Medications   Norgestimate-Ethinyl Estradiol Triphasic (TRI-ESTARYLLA) 0.18/0.215/0.25 MG-35 MCG tablet       No follow-ups on file.      Meds ordered this encounter  Medications   Norgestimate-Ethinyl Estradiol Triphasic (TRI-ESTARYLLA) 0.18/0.215/0.25 MG-35 MCG tablet    Sig: Take 1 tablet by mouth daily. Appt for further refills    Dispense:  28 tablet    Refill:  3    No orders of the defined types were placed in this encounter.    Charlton Amor, DO  [facility] at [facility] [phone] (phone) [phone] (fax)  [facility]

## 2022-09-19 ENCOUNTER — Other Ambulatory Visit (HOSPITAL_COMMUNITY)
Admission: RE | Admit: 2022-09-19 | Discharge: 2022-09-19 | Disposition: A | Discharge status: Home / Self Care | Payer: 59 | Source: Ambulatory Visit | Attending: Physician Assistant | Admitting: Physician Assistant

## 2022-09-19 ENCOUNTER — Ambulatory Visit: Payer: 59 | Admitting: Physician Assistant

## 2022-09-19 ENCOUNTER — Encounter: Payer: Self-pay | Admitting: Physician Assistant

## 2022-09-19 VITALS — BP 106/71 | HR 76 | Ht 65.0 in | Wt 210.0 lb

## 2022-09-19 DIAGNOSIS — Z131 Encounter for screening for diabetes mellitus: Secondary | ICD-10-CM | POA: Diagnosis not present

## 2022-09-19 DIAGNOSIS — E6609 Other obesity due to excess calories: Secondary | ICD-10-CM

## 2022-09-19 DIAGNOSIS — Z124 Encounter for screening for malignant neoplasm of cervix: Secondary | ICD-10-CM | POA: Insufficient documentation

## 2022-09-19 DIAGNOSIS — Z0000 Encounter for general adult medical examination without abnormal findings: Secondary | ICD-10-CM | POA: Diagnosis not present

## 2022-09-19 DIAGNOSIS — Z13220 Encounter for screening for lipoid disorders: Secondary | ICD-10-CM

## 2022-09-19 DIAGNOSIS — Z6834 Body mass index (BMI) 34.0-34.9, adult: Secondary | ICD-10-CM

## 2022-09-19 DIAGNOSIS — F3342 Major depressive disorder, recurrent, in full remission: Secondary | ICD-10-CM

## 2022-09-19 NOTE — Progress Notes (Signed)
Complete physical exam  Patient: Erin Ashley   DOB: 03-28-1990   32 y.o. Female  MRN: [id number]  Subjective:    Chief Complaint  Patient presents with   Annual Exam    Annual pap    Erin Ashley is a 32 y.o. female who presents today for a complete physical exam. She reports consuming a general diet.  Going to the gym 3-4 times a week with cardio and weights.   She generally feels well. She reports sleeping well. She does not have additional problems to discuss today. She is sexually active with one partner.    Most recent fall risk assessment:    09/06/2022    1:09 PM  Fall Risk   Falls in the past year? 0  Number falls in past yr: 0  Injury with Fall? 0  Risk for fall due to : No Fall Risks  Follow up Falls evaluation completed     Most recent depression screenings:    09/06/2022    1:10 PM 09/17/2020    8:12 AM  PHQ 2/9 Scores  PHQ - 2 Score 0 0  PHQ- 9 Score  0    Vision:Within last year, Dental: No current dental problems and Receives regular dental care, and STD: The patient denies history of sexually transmitted disease.  Patient Active Problem List   Diagnosis Date Noted   Dysthymia 09/17/2020   Class 1 obesity due to excess calories without serious comorbidity with body mass index (BMI) of 34.0 to 34.9 in adult 09/17/2020   Folliculitis 09/29/2019   Chronic cholecystitis 02/28/2017   Gallbladder sludge 02/02/2017   No energy 02/02/2017   Non-intractable vomiting with nausea 02/02/2017   RUQ pain 02/02/2017   Dry heaves 11/29/2016   Nausea 11/29/2016   Elevated ALT measurement 11/24/2016   Acute bilateral upper abdominal pain 11/23/2016   Constipation 10/11/2015   Abdominal pain, left lower quadrant 10/11/2015   Axillary mass 10/11/2015   Mouth ulcers 06/30/2015   Gastroesophageal reflux disease with esophagitis 06/30/2015   Hearing impairment 05/04/2015   Impaired reading comprehension 05/04/2015   Pollen allergies 06/22/2014   Anxiety state  07/01/2013   Depression 07/01/2013   Past Medical History:  Diagnosis Date   Anxiety    Chronic cholecystitis    Depression    GERD (gastroesophageal reflux disease)    Headache    allergy headaches   Past Surgical History:  Procedure Laterality Date   CHOLECYSTECTOMY N/A 03/08/2017   Procedure: LAPAROSCOPIC CHOLECYSTECTOMY WITH INTRAOPERATIVE CHOLANGIOGRAM;  Surgeon: Darnell Level, MD;  Location: [facility];  Service: General;  Laterality: N/A;   TONSILLECTOMY  as child   and adenoids   WISDOM TOOTH EXTRACTION     Family History  Problem Relation Age of Onset   Hyperlipidemia Father    Hypertension Father    Diabetes Father    Hypertension Paternal Grandmother    Hyperlipidemia Paternal Grandmother    Hyperlipidemia Paternal Grandfather    Hypertension Paternal Grandfather    Allergies  Allergen Reactions   Amoxicillin Anaphylaxis and Swelling    Has patient had a PCN reaction causing immediate rash, facial/tongue/throat swelling, SOB or lightheadedness with hypotension: Yes Has patient had a PCN reaction causing severe rash involving mucus membranes or skin necrosis: No Has patient had a PCN reaction that required hospitalization: No Has patient had a PCN reaction occurring within the last 10 years: Yes Throat swelling If all of the above answers are "NO", then may proceed with Cephalosporin use.  Penicillins Rash    REACTION: rash around mouth Has patient had a PCN reaction causing immediate rash, facial/tongue/throat swelling, SOB or lightheadedness with hypotension: No Has patient had a PCN reaction causing severe rash involving mucus membranes or skin necrosis: No Has patient had a PCN reaction that required hospitalization: No Has patient had a PCN reaction occurring within the last 10 years: No If all of the above answers are "NO", then may proceed with Cephalosporin use.       Patient Care Team: Nolene Ebbs as PCP - General (Family Medicine)    Outpatient Medications Prior to Visit  Medication Sig   acetaminophen (TYLENOL) 500 MG tablet Take 1,000 mg by mouth every 6 (six) hours as needed (for pain.).   Fexofenadine HCl (MUCINEX ALLERGY PO) Take 1 tablet by mouth daily as needed.   fluticasone (FLONASE) 50 MCG/ACT nasal spray One spray in each nostril twice a day, use left hand for right nostril, and right hand for left nostril. (Patient taking differently: Place 1 spray into both nostrils daily. One spray in each nostril twice a day, use left hand for right nostril, and right hand for left nostril.)   Norgestimate-Ethinyl Estradiol Triphasic (TRI-ESTARYLLA) 0.18/0.215/0.25 MG-35 MCG tablet Take 1 tablet by mouth daily. Appt for further refills   [DISCONTINUED] clindamycin (CLINDAGEL) 1 % gel Apply to infected areas 3 times a day for 7 days.   [DISCONTINUED] mupirocin ointment (BACTROBAN) 2 % Apply to affected area TID for 7 days.   No facility-administered medications prior to visit.    Review of Systems  All other systems reviewed and are negative.         Objective:     BP 106/71   Pulse 76   Ht 5\' 5"  (1.651 m)   Wt 210 lb (95.3 kg)   SpO2 99%   BMI 34.95 kg/m  BP Readings from Last 3 Encounters:  09/19/22 106/71  09/06/22 122/85  09/17/20 129/88   Wt Readings from Last 3 Encounters:  09/19/22 210 lb (95.3 kg)  09/06/22 210 lb 8 oz (95.5 kg)  09/17/20 210 lb (95.3 kg)      Physical Exam  BP 106/71   Pulse 76   Ht 5\' 5"  (1.651 m)   Wt 210 lb (95.3 kg)   SpO2 99%   BMI 34.95 kg/m   General Appearance:    Alert, cooperative, obese no distress, appears stated age  Head:    Normocephalic, without obvious abnormality, atraumatic  Eyes:    PERRL, conjunctiva/corneas clear, EOM's intact, fundi    benign, both eyes  Ears:    Normal TM's and external ear canals, both ears  Nose:   Nares normal, septum midline, mucosa normal, no drainage    or sinus tenderness  Throat:   Lips, mucosa, and tongue normal;  teeth and gums normal  Neck:   Supple, symmetrical, trachea midline, no adenopathy;    thyroid:  no enlargement/tenderness/nodules; no carotid   bruit or JVD  Back:     Symmetric, no curvature, ROM normal, no CVA tenderness  Lungs:     Clear to auscultation bilaterally, respirations unlabored  Chest Wall:    No tenderness or deformity   Heart:    Regular rate and rhythm, S1 and S2 normal, no murmur, rub   or gallop  Breast Exam:    No tenderness, masses, or nipple abnormality  Abdomen:     Soft, non-tender, bowel sounds active all four quadrants,    no  masses, no organomegaly  Genitalia:    Normal female without lesion, discharge or tenderness. No adnexal tenderness. Friable cervix.     Extremities:   Extremities normal, atraumatic, no cyanosis or edema  Pulses:   2+ and symmetric all extremities  Skin:   Skin color, texture, turgor normal, no rashes or lesions  Lymph nodes:   Cervical, supraclavicular, and axillary nodes normal  Neurologic:   CNII-XII intact, normal strength, sensation and reflexes    throughout      Assessment & Plan:    Routine Health Maintenance and Physical Exam  Immunization History  Administered Date(s) Administered   Hepatitis B, ADULT 09/11/2014   Influenza,inj,Quad PF,6+ Mos 10/11/2015, 09/29/2019   Tdap 09/11/2014    Health Maintenance  Topic Date Due   PAP SMEAR-Modifier  05/23/2022   COVID-19 Vaccine (1 - 2023-24 season) 01/30/2023 (Originally 09/30/2021)   Hepatitis C Screening  01/30/2023 (Originally 06/22/2009)   HIV Screening  01/30/2023 (Originally 06/23/2006)   INFLUENZA VACCINE  04/30/2023 (Originally 08/31/2022)   DTaP/Tdap/Td (2 - Td or Tdap) 09/10/2024   HPV VACCINES  Aged Out    Discussed health benefits of physical activity, and encouraged her to engage in regular exercise appropriate for her age and condition.  Marland KitchenKarie Mainland was seen today for annual exam.  Diagnoses and all orders for this visit:  Routine physical examination -     Lipid  panel -     CMP14+EGFR -     TSH  Routine Papanicolaou smear -     Cytology - PAP  Screening for lipid disorders -     Lipid panel  Screening for diabetes mellitus -     CMP14+EGFR  Class 1 obesity due to excess calories without serious comorbidity with body mass index (BMI) of 34.0 to 34.9 in adult  Recurrent major depressive disorder, in full remission (HCC)   .Marland Kitchen Discussed 150 minutes of exercise a week.  Encouraged vitamin D 1000 units and Calcium 1300mg  or 4 servings of dairy a day.  PHQ no concerns Fasting labs ordered today Pap done today, STD screening ordered.  OCP UTD and refilled.  Pt declined vaccines.  Follow up in 1 year.  Return in about 1 year (around 09/19/2023).     Tandy Gaw, PA-C

## 2022-09-19 NOTE — Patient Instructions (Signed)

## 2022-09-20 LAB — TSH: TSH: 3.34 u[IU]/mL (ref 0.450–4.500)

## 2022-09-20 LAB — LIPID PANEL
Chol/HDL Ratio: 2.7 ratio (ref 0.0–4.4)
Cholesterol, Total: 158 mg/dL (ref 100–199)
HDL: 59 mg/dL (ref >39)
LDL Chol Calc (NIH): 76 mg/dL (ref 0–99)
Triglycerides: 131 mg/dL (ref 0–149)
VLDL Cholesterol Cal: 23 mg/dL (ref 5–40)

## 2022-09-20 LAB — CMP14+EGFR
ALT: 8 IU/L (ref 0–32)
AST: 11 IU/L (ref 0–40)
Albumin: 4.3 g/dL (ref 3.9–4.9)
Alkaline Phosphatase: 90 IU/L (ref 44–121)
BUN/Creatinine Ratio: 11 (ref 9–23)
BUN: 10 mg/dL (ref 6–20)
Bilirubin Total: 0.2 mg/dL (ref 0.0–1.2)
CO2: 19 mmol/L — ABNORMAL LOW (ref 20–29)
Calcium: 8.9 mg/dL (ref 8.7–10.2)
Chloride: 105 mmol/L (ref 96–106)
Creatinine, Ser: 0.92 mg/dL (ref 0.57–1.00)
Globulin, Total: 2.2 g/dL (ref 1.5–4.5)
Glucose: 81 mg/dL (ref 70–99)
Potassium: 4.2 mmol/L (ref 3.5–5.2)
Sodium: 141 mmol/L (ref 134–144)
Total Protein: 6.5 g/dL (ref 6.0–8.5)
eGFR: 85 mL/min/{1.73_m2} (ref >59)

## 2022-09-20 NOTE — Progress Notes (Signed)
Skyley,   Thyroid in normal range.  Cholesterol looks great.  Kidney, liver, glucose look great.

## 2022-09-25 LAB — CYTOLOGY - PAP
Chlamydia: NEGATIVE
Comment: NEGATIVE
Comment: NEGATIVE
Comment: NEGATIVE
Comment: NEGATIVE
Comment: NORMAL
Diagnosis: NEGATIVE
HSV1: NEGATIVE
HSV2: NEGATIVE
High risk HPV: NEGATIVE
Neisseria Gonorrhea: NEGATIVE
Trichomonas: NEGATIVE

## 2022-09-25 NOTE — Progress Notes (Signed)
Negative for HPV Normal cells.  No STDs.  Follow up in 3 years for next pap.  GREAT news.

## 2022-12-01 ENCOUNTER — Other Ambulatory Visit: Payer: Self-pay | Admitting: Family Medicine

## 2022-12-01 DIAGNOSIS — F341 Dysthymic disorder: Secondary | ICD-10-CM

## 2022-12-01 DIAGNOSIS — Z3041 Encounter for surveillance of contraceptive pills: Secondary | ICD-10-CM

## 2023-02-14 ENCOUNTER — Other Ambulatory Visit: Payer: Self-pay | Admitting: Family Medicine

## 2023-02-14 DIAGNOSIS — F341 Dysthymic disorder: Secondary | ICD-10-CM

## 2023-02-14 DIAGNOSIS — Z3041 Encounter for surveillance of contraceptive pills: Secondary | ICD-10-CM

## 2023-08-09 ENCOUNTER — Other Ambulatory Visit: Payer: Self-pay | Admitting: Physician Assistant

## 2023-08-09 DIAGNOSIS — F341 Dysthymic disorder: Secondary | ICD-10-CM

## 2023-08-09 DIAGNOSIS — Z3041 Encounter for surveillance of contraceptive pills: Secondary | ICD-10-CM

## 2024-01-22 ENCOUNTER — Other Ambulatory Visit: Payer: Self-pay | Admitting: Physician Assistant

## 2024-01-22 DIAGNOSIS — F341 Dysthymic disorder: Secondary | ICD-10-CM

## 2024-01-22 DIAGNOSIS — Z3041 Encounter for surveillance of contraceptive pills: Secondary | ICD-10-CM
